# Patient Record
Sex: Female | Born: 1935 | Race: White | Hispanic: No | Smoking: Current every day smoker
Health system: Southern US, Community
[De-identification: ages and names within clinical notes are randomized; demographics above are authoritative.]

## PROBLEM LIST (undated history)

## (undated) DIAGNOSIS — E039 Hypothyroidism, unspecified: Secondary | ICD-10-CM

## (undated) DIAGNOSIS — I10 Essential (primary) hypertension: Secondary | ICD-10-CM

## (undated) DIAGNOSIS — S42009A Fracture of unspecified part of unspecified clavicle, initial encounter for closed fracture: Secondary | ICD-10-CM

## (undated) DIAGNOSIS — R011 Cardiac murmur, unspecified: Secondary | ICD-10-CM

## (undated) DIAGNOSIS — M1990 Unspecified osteoarthritis, unspecified site: Secondary | ICD-10-CM

## (undated) DIAGNOSIS — C3490 Malignant neoplasm of unspecified part of unspecified bronchus or lung: Secondary | ICD-10-CM

## (undated) DIAGNOSIS — Z8673 Personal history of transient ischemic attack (TIA), and cerebral infarction without residual deficits: Secondary | ICD-10-CM

## (undated) DIAGNOSIS — C419 Malignant neoplasm of bone and articular cartilage, unspecified: Secondary | ICD-10-CM

## (undated) DIAGNOSIS — C4490 Unspecified malignant neoplasm of skin, unspecified: Secondary | ICD-10-CM

## (undated) HISTORY — DX: Cardiac murmur, unspecified: R01.1

## (undated) HISTORY — PX: ABDOMINAL HYSTERECTOMY: SHX81

## (undated) HISTORY — DX: Essential (primary) hypertension: I10

## (undated) HISTORY — PX: LEG SURGERY: SHX1003

## (undated) HISTORY — DX: Hypothyroidism, unspecified: E03.9

## (undated) HISTORY — DX: Unspecified osteoarthritis, unspecified site: M19.90

## (undated) HISTORY — DX: Unspecified malignant neoplasm of skin, unspecified: C44.90

---

## 1971-07-12 HISTORY — PX: LAMINECTOMY: SHX219

## 2008-10-08 ENCOUNTER — Emergency Department (HOSPITAL_COMMUNITY): Admission: EM | Admit: 2008-10-08 | Discharge: 2008-10-08 | Payer: Self-pay | Admitting: Emergency Medicine

## 2013-06-25 ENCOUNTER — Encounter: Payer: Self-pay | Admitting: Interventional Cardiology

## 2013-06-25 ENCOUNTER — Ambulatory Visit (INDEPENDENT_AMBULATORY_CARE_PROVIDER_SITE_OTHER): Payer: Medicare Other | Admitting: Interventional Cardiology

## 2013-06-25 ENCOUNTER — Encounter: Payer: Self-pay | Admitting: Cardiology

## 2013-06-25 VITALS — BP 146/90 | HR 58 | Ht 64.0 in | Wt 146.0 lb

## 2013-06-25 DIAGNOSIS — F17200 Nicotine dependence, unspecified, uncomplicated: Secondary | ICD-10-CM

## 2013-06-25 DIAGNOSIS — E782 Mixed hyperlipidemia: Secondary | ICD-10-CM

## 2013-06-25 DIAGNOSIS — I359 Nonrheumatic aortic valve disorder, unspecified: Secondary | ICD-10-CM

## 2013-06-25 DIAGNOSIS — I10 Essential (primary) hypertension: Secondary | ICD-10-CM

## 2013-06-25 NOTE — Progress Notes (Signed)
Patient ID: Nichole Werner, female   DOB: 01-23-1935, 78 y.o.   MRN: [id number]     Patient ID: Nichole Werner MRN: [id number] DOB/AGE: [birth year] 78 y.o.   Referring Physician Dr. Ihor Dow   Reason for Consultation: murmur  HPI: 78 y/o who has had a calcified aortic valve by eho in 2005.  She was found to have a murmur on exam.  SHe has not had a stress test before.  A chemical stress test was suggested at one point.  She declined.  She denies CP, pressure tightness in her chest.  She does not walk much because of back and leg surgery.  She lives in an independent living.  No lightheadedness, syncope, palpitations.  No nausea, vomiting, orthopnea.   I would the records. She did not recall having an echocardiogram in 2012. She had moderate aortic valve stenosis at that time. Normal left jugular function. She currently denies any cardiac symptoms. She is not interested in stopping smoking. She states that a cholesterol medicine as been recommended in the past. She has declined taking any lipid-lowering therapy.   Current Outpatient Prescriptions  Medication Sig Dispense Refill  . levothyroxine (SYNTHROID, LEVOTHROID) 25 MCG tablet Take 25 mcg by mouth daily before breakfast.      . lisinopril (PRINIVIL,ZESTRIL) 10 MG tablet Take 10 mg by mouth daily.      . metoprolol succinate (TOPROL-XL) 100 MG 24 hr tablet Take 100 mg by mouth daily. Take with or immediately following a meal.      . naproxen sodium (ANAPROX) 220 MG tablet Take 220 mg by mouth as needed.       No current facility-administered medications for this visit.   Past Medical History  Diagnosis Date  . Hypertension   . OA (osteoarthritis)   . Skin cancer   . Hypothyroidism   . Undiagnosed cardiac murmurs     No family history on file.  History   Social History  . Marital Status: Widowed    Spouse Name: N/A    Number of Children: N/A  . Years of Education: N/A   Occupational History  . Not on file.   Social  History Main Topics  . Smoking status: Current Some Day Smoker  . Smokeless tobacco: Not on file     Comment: about 5 cigarettes daily  . Alcohol Use: No  . Drug Use: No  . Sexual Activity: Not on file   Other Topics Concern  . Not on file   Social History Narrative  . No narrative on file    No past surgical history on file.    (Not in a hospital admission)  Review of systems complete and found to be negative unless listed above .  No nausea, vomiting.  No fever chills, No focal weakness,  No palpitations.  Physical Exam: Filed Vitals:   06/25/13 1450  BP: 146/90  Pulse: 58    Weight: 146 lb (66.225 kg)  Physical exam:  [place]/AT EOMI No JVD, No carotid bruit RRR S1S2  No wheezing Soft. NT, nondistended No edema. No focal motor or sensory deficits Normal affect  Labs:   No results found for this basename: WBC, HGB, HCT, MCV, PLT   No results found for this basename: NA, K, CL, CO2, BUN, CREATININE, CALCIUM, LABALBU, PROT, BILITOT, ALKPHOS, ALT, AST, GLUCOSE,  in the last 168 hours No results found for this basename: CKTOTAL, CKMB, CKMBINDEX, TROPONINI    No results found for this basename: CHOL  No results found for this basename: HDL   No results found for this basename: LDLCALC   No results found for this basename: TRIG   No results found for this basename: CHOLHDL   No results found for this basename: LDLDIRECT      Radiology:2012 echo showed moderate AS.   EKG: NSR, NSST  ASSESSMENT AND PLAN:   Heart murmur: Will check echocardiogram.  She had an echo in 2005 showing calcificaiton of the aortic valve. Echocardiogram in 2012 showed moderate aortic stenosis.  Tobacco abuse:  She needs to stop smoking.   HTN: Borderline control.  She should get it checked.  Continue lisinopril.  Hyperlipidemia:  LDL 177.  Simvastatin was recommended.  She is not taking that at this time.  I think a cholesterol-lowering medicine will be beneficial. Signed:   Fredric Mare, MD, [facility] 06/25/2013, 3:15 PM

## 2013-06-25 NOTE — Patient Instructions (Signed)
Your physician has requested that you have an echocardiogram. Echocardiography is a painless test that uses sound waves to create images of your heart. It provides your doctor with information about the size and shape of your heart and how well your heart's chambers and valves are working. This procedure takes approximately one hour. There are no restrictions for this procedure.  Your physician wants you to follow-up in: 4 months with Dr. Eldridge Dace.  You will receive a reminder letter in the mail two months in advance. If you don't receive a letter, please call our office to schedule the follow-up appointment.

## 2013-06-28 ENCOUNTER — Ambulatory Visit: Payer: Self-pay | Admitting: Interventional Cardiology

## 2013-07-09 ENCOUNTER — Encounter: Payer: Self-pay | Admitting: Cardiovascular Disease

## 2013-07-09 ENCOUNTER — Ambulatory Visit (HOSPITAL_COMMUNITY): Payer: Medicare Other | Attending: Cardiovascular Disease | Admitting: Radiology

## 2013-07-09 DIAGNOSIS — F17200 Nicotine dependence, unspecified, uncomplicated: Secondary | ICD-10-CM | POA: Insufficient documentation

## 2013-07-09 DIAGNOSIS — R011 Cardiac murmur, unspecified: Secondary | ICD-10-CM | POA: Insufficient documentation

## 2013-07-09 DIAGNOSIS — E785 Hyperlipidemia, unspecified: Secondary | ICD-10-CM | POA: Insufficient documentation

## 2013-07-09 DIAGNOSIS — I10 Essential (primary) hypertension: Secondary | ICD-10-CM | POA: Insufficient documentation

## 2013-07-09 DIAGNOSIS — I080 Rheumatic disorders of both mitral and aortic valves: Secondary | ICD-10-CM | POA: Insufficient documentation

## 2013-07-09 DIAGNOSIS — I079 Rheumatic tricuspid valve disease, unspecified: Secondary | ICD-10-CM | POA: Insufficient documentation

## 2013-07-09 DIAGNOSIS — I359 Nonrheumatic aortic valve disorder, unspecified: Secondary | ICD-10-CM

## 2013-07-09 NOTE — Progress Notes (Signed)
Echocardiogram performed.  

## 2013-11-12 ENCOUNTER — Emergency Department (HOSPITAL_COMMUNITY): Payer: Medicare Other

## 2013-11-12 ENCOUNTER — Encounter (HOSPITAL_COMMUNITY): Payer: Self-pay | Admitting: Emergency Medicine

## 2013-11-12 ENCOUNTER — Emergency Department (HOSPITAL_COMMUNITY)
Admission: EM | Admit: 2013-11-12 | Discharge: 2013-11-12 | Disposition: A | Discharge status: Home / Self Care | Payer: Medicare Other | Attending: Emergency Medicine | Admitting: Emergency Medicine

## 2013-11-12 DIAGNOSIS — Z792 Long term (current) use of antibiotics: Secondary | ICD-10-CM | POA: Insufficient documentation

## 2013-11-12 DIAGNOSIS — M549 Dorsalgia, unspecified: Secondary | ICD-10-CM

## 2013-11-12 DIAGNOSIS — Z85828 Personal history of other malignant neoplasm of skin: Secondary | ICD-10-CM | POA: Insufficient documentation

## 2013-11-12 DIAGNOSIS — F17200 Nicotine dependence, unspecified, uncomplicated: Secondary | ICD-10-CM | POA: Insufficient documentation

## 2013-11-12 DIAGNOSIS — E039 Hypothyroidism, unspecified: Secondary | ICD-10-CM | POA: Insufficient documentation

## 2013-11-12 DIAGNOSIS — M1990 Unspecified osteoarthritis, unspecified site: Secondary | ICD-10-CM | POA: Insufficient documentation

## 2013-11-12 DIAGNOSIS — Z91040 Latex allergy status: Secondary | ICD-10-CM | POA: Insufficient documentation

## 2013-11-12 DIAGNOSIS — M545 Low back pain, unspecified: Secondary | ICD-10-CM | POA: Insufficient documentation

## 2013-11-12 DIAGNOSIS — R011 Cardiac murmur, unspecified: Secondary | ICD-10-CM | POA: Insufficient documentation

## 2013-11-12 DIAGNOSIS — Z79899 Other long term (current) drug therapy: Secondary | ICD-10-CM | POA: Insufficient documentation

## 2013-11-12 DIAGNOSIS — I10 Essential (primary) hypertension: Secondary | ICD-10-CM | POA: Insufficient documentation

## 2013-11-12 LAB — URINALYSIS, ROUTINE W REFLEX MICROSCOPIC
Bilirubin Urine: NEGATIVE
GLUCOSE, UA: NEGATIVE mg/dL
HGB URINE DIPSTICK: NEGATIVE
Ketones, ur: NEGATIVE mg/dL
Nitrite: POSITIVE — AB
PH: 6 (ref 5.0–8.0)
Protein, ur: NEGATIVE mg/dL
SPECIFIC GRAVITY, URINE: 1.017 (ref 1.005–1.030)
Urobilinogen, UA: 1 mg/dL (ref 0.0–1.0)

## 2013-11-12 LAB — URINE MICROSCOPIC-ADD ON

## 2013-11-12 MED ORDER — OXYCODONE-ACETAMINOPHEN 5-325 MG PO TABS
1.0000 | ORAL_TABLET | Freq: Once | ORAL | Status: AC
  Administered 2013-11-12: 1 via ORAL
  Filled 2013-11-12: qty 1

## 2013-11-12 MED ORDER — CEPHALEXIN 500 MG PO CAPS: 500.0000 mg | ORAL_CAPSULE | Freq: Three times a day (TID) | ORAL | Status: DC

## 2013-11-12 MED ORDER — HYDROCODONE-ACETAMINOPHEN 5-325 MG PO TABS: 1.0000 | ORAL_TABLET | Freq: Four times a day (QID) | ORAL | Status: DC | PRN

## 2013-11-12 NOTE — ED Notes (Signed)
Patient transported to X-ray 

## 2013-11-12 NOTE — Discharge Instructions (Signed)
Follow up with your family md and dr. Nelva Bush

## 2013-11-12 NOTE — ED Provider Notes (Signed)
CSN: [id number]     Arrival date & time 11/12/13  1628 History   First MD Initiated Contact with Patient 11/12/13 1721     Chief Complaint  Patient presents with  . Back Pain     (Consider location/radiation/quality/duration/timing/severity/associated sxs/prior Treatment) Patient is a 79 y.o. female presenting with back pain. The history is provided by the patient (pt complains of back pain).  Back Pain Location:  Lumbar spine Quality:  Aching Radiates to:  Does not radiate Pain severity:  Moderate Onset quality:  Gradual Timing:  Constant Progression:  Unchanged Chronicity:  New Context: not emotional stress   Associated symptoms: no abdominal pain, no chest pain and no headaches     Past Medical History  Diagnosis Date  . Hypertension   . OA (osteoarthritis)   . Skin cancer   . Hypothyroidism   . Undiagnosed cardiac murmurs    History reviewed. No pertinent past surgical history. Family History  Problem Relation Age of Onset  . Heart disease Sister     CABG   History  Substance Use Topics  . Smoking status: Current Some Day Smoker  . Smokeless tobacco: Not on file     Comment: about 5 cigarettes daily  . Alcohol Use: No   OB History   Grav Para Term Preterm Abortions TAB SAB Ect Mult Living                 Review of Systems  Constitutional: Negative for appetite change and fatigue.  HENT: Negative for congestion, ear discharge and sinus pressure.   Eyes: Negative for discharge.  Respiratory: Negative for cough.   Cardiovascular: Negative for chest pain.  Gastrointestinal: Negative for abdominal pain and diarrhea.  Genitourinary: Negative for frequency and hematuria.  Musculoskeletal: Positive for back pain.  Skin: Negative for rash.  Neurological: Negative for seizures and headaches.  Psychiatric/Behavioral: Negative for hallucinations.      Allergies  Benadryl; Benzodiazepines; and Latex  Home Medications   Prior to Admission medications    Medication Sig Start Date End Date Taking? Authorizing Provider  beta carotene w/minerals (OCUVITE) tablet Take 1 tablet by mouth daily.   Yes Historical Provider, MD  levothyroxine (SYNTHROID, LEVOTHROID) 25 MCG tablet Take 25 mcg by mouth daily before breakfast.   Yes Historical Provider, MD  lisinopril (PRINIVIL,ZESTRIL) 10 MG tablet Take 10 mg by mouth daily.   Yes Historical Provider, MD  metoprolol succinate (TOPROL-XL) 100 MG 24 hr tablet Take 100 mg by mouth daily. Take with or immediately following a meal.   Yes Historical Provider, MD  naproxen sodium (ANAPROX) 220 MG tablet Take 440 mg by mouth 2 (two) times daily as needed (pain).    Yes Historical Provider, MD  cephALEXin (KEFLEX) 500 MG capsule Take 1 capsule (500 mg total) by mouth 3 (three) times daily. 11/12/13   Maudry Diego, MD  HYDROcodone-acetaminophen (NORCO/VICODIN) 5-325 MG per tablet Take 1 tablet by mouth every 6 (six) hours as needed for moderate pain. 11/12/13   Maudry Diego, MD   BP 148/74  Pulse 70  Temp(Src) 97.9 F (36.6 C) (Oral)  Resp 14  Ht 5\' 4"  (1.626 m)  Wt 138 lb (62.596 kg)  BMI 23.68 kg/m2  SpO2 100% Physical Exam  Constitutional: She is oriented to person, place, and time. She appears well-developed.  HENT:  Head: Normocephalic.  Eyes: Conjunctivae and EOM are normal. No scleral icterus.  Neck: Neck supple. No thyromegaly present.  Cardiovascular: Normal rate and regular rhythm.  Exam reveals no gallop and no friction rub.   No murmur heard. Pulmonary/Chest: No stridor. She has no wheezes. She has no rales. She exhibits no tenderness.  Abdominal: She exhibits no distension. There is no tenderness. There is no rebound.  Musculoskeletal: Normal range of motion. She exhibits tenderness. She exhibits no edema.  Tender lumbar spine  Lymphadenopathy:    She has no cervical adenopathy.  Neurological: She is oriented to person, place, and time. She exhibits normal muscle tone. Coordination normal.   Skin: No rash noted. No erythema.  Psychiatric: She has a normal mood and affect. Her behavior is normal.    ED Course  Procedures (including critical care time) Labs Review Labs Reviewed  URINALYSIS, ROUTINE W REFLEX MICROSCOPIC - Abnormal; Notable for the following:    APPearance CLOUDY (*)    Nitrite POSITIVE (*)    Leukocytes, UA SMALL (*)    All other components within normal limits  URINE MICROSCOPIC-ADD ON - Abnormal; Notable for the following:    Bacteria, UA MANY (*)    All other components within normal limits    Imaging Review Dg Lumbar Spine Complete  11/12/2013   CLINICAL DATA:  Low back pain  EXAM: LUMBAR SPINE - COMPLETE 4+ VIEW  COMPARISON:  None  FINDINGS: There is a compression deformity involving the L4 vertebra. This is age-indeterminate with loss of approximately 15% of the vertebral body height. Mild multi level lumbar degenerative disc disease is noted. This is most severe at L5-S1. Atherosclerotic disease involves the abdominal aorta.  IMPRESSION: 1. L4 compression fracture is age indeterminate. 2. Lumbar degenerative disc disease 3. Atherosclerosis.   Electronically Signed   By: Kerby Moors M.D.   On: 11/12/2013 18:00     EKG Interpretation None      MDM   Final diagnoses:  Back pain    The chart was scribed for me under my direct supervision.  I personally performed the history, physical, and medical decision making and all procedures in the evaluation of this patient.Maudry Diego, MD 11/12/13 [phone]

## 2013-11-12 NOTE — ED Notes (Signed)
Pt states that she had a laminectomy years ago and has had no problem with her back since until several days ago. Has been taking aleve at home w/ no relief. Alert and oriented. Ambulatory.

## 2013-11-20 ENCOUNTER — Other Ambulatory Visit (HOSPITAL_COMMUNITY): Payer: Self-pay | Admitting: Physical Medicine and Rehabilitation

## 2013-11-20 DIAGNOSIS — R935 Abnormal findings on diagnostic imaging of other abdominal regions, including retroperitoneum: Secondary | ICD-10-CM

## 2013-11-20 DIAGNOSIS — C419 Malignant neoplasm of bone and articular cartilage, unspecified: Secondary | ICD-10-CM

## 2013-11-20 HISTORY — DX: Malignant neoplasm of bone and articular cartilage, unspecified: C41.9

## 2013-11-21 ENCOUNTER — Telehealth: Payer: Self-pay | Admitting: Hematology & Oncology

## 2013-11-21 ENCOUNTER — Other Ambulatory Visit: Payer: Self-pay | Admitting: Hematology & Oncology

## 2013-11-21 DIAGNOSIS — C3490 Malignant neoplasm of unspecified part of unspecified bronchus or lung: Secondary | ICD-10-CM

## 2013-11-21 NOTE — Telephone Encounter (Signed)
Left message for medical records to fax MRI results, per Dr. Antonieta Pert request, he is also aware of bone scan on 5-28

## 2013-11-21 NOTE — Telephone Encounter (Signed)
Pt aware of 5-15 PET be NPO 6 hrs. Nichole Werner will let pt know to be here on 5-18 at 1030 for lab. She is aware to be NPO 4 hrs and to drink contrast for 5-18 CT

## 2013-11-22 ENCOUNTER — Telehealth: Payer: Self-pay | Admitting: Hematology & Oncology

## 2013-11-22 ENCOUNTER — Encounter (HOSPITAL_COMMUNITY): Payer: Self-pay | Admitting: Emergency Medicine

## 2013-11-22 ENCOUNTER — Encounter (HOSPITAL_COMMUNITY)
Admission: RE | Admit: 2013-11-22 | Discharge: 2013-11-22 | Disposition: A | Discharge status: Home / Self Care | Payer: Medicare Other | Source: Ambulatory Visit | Attending: Hematology & Oncology | Admitting: Hematology & Oncology

## 2013-11-22 ENCOUNTER — Inpatient Hospital Stay (HOSPITAL_COMMUNITY)
Admission: EM | Admit: 2013-11-22 | Discharge: 2013-12-03 | DRG: 542 | Disposition: A | Discharge status: Hospice | Payer: Medicare Other | Attending: Internal Medicine | Admitting: Internal Medicine

## 2013-11-22 ENCOUNTER — Emergency Department (HOSPITAL_COMMUNITY): Payer: Medicare Other

## 2013-11-22 ENCOUNTER — Encounter (HOSPITAL_COMMUNITY): Payer: Self-pay

## 2013-11-22 DIAGNOSIS — I714 Abdominal aortic aneurysm, without rupture, unspecified: Secondary | ICD-10-CM | POA: Diagnosis present

## 2013-11-22 DIAGNOSIS — R011 Cardiac murmur, unspecified: Secondary | ICD-10-CM | POA: Diagnosis present

## 2013-11-22 DIAGNOSIS — S32040S Wedge compression fracture of fourth lumbar vertebra, sequela: Secondary | ICD-10-CM

## 2013-11-22 DIAGNOSIS — S3210XA Unspecified fracture of sacrum, initial encounter for closed fracture: Secondary | ICD-10-CM

## 2013-11-22 DIAGNOSIS — C7951 Secondary malignant neoplasm of bone: Secondary | ICD-10-CM | POA: Diagnosis present

## 2013-11-22 DIAGNOSIS — D473 Essential (hemorrhagic) thrombocythemia: Secondary | ICD-10-CM | POA: Diagnosis present

## 2013-11-22 DIAGNOSIS — S322XXA Fracture of coccyx, initial encounter for closed fracture: Secondary | ICD-10-CM

## 2013-11-22 DIAGNOSIS — C801 Malignant (primary) neoplasm, unspecified: Secondary | ICD-10-CM

## 2013-11-22 DIAGNOSIS — E41 Nutritional marasmus: Secondary | ICD-10-CM | POA: Diagnosis present

## 2013-11-22 DIAGNOSIS — R634 Abnormal weight loss: Secondary | ICD-10-CM | POA: Diagnosis present

## 2013-11-22 DIAGNOSIS — C3490 Malignant neoplasm of unspecified part of unspecified bronchus or lung: Secondary | ICD-10-CM | POA: Insufficient documentation

## 2013-11-22 DIAGNOSIS — D75839 Thrombocytosis, unspecified: Secondary | ICD-10-CM

## 2013-11-22 DIAGNOSIS — S32040A Wedge compression fracture of fourth lumbar vertebra, initial encounter for closed fracture: Secondary | ICD-10-CM

## 2013-11-22 DIAGNOSIS — M1990 Unspecified osteoarthritis, unspecified site: Secondary | ICD-10-CM | POA: Diagnosis present

## 2013-11-22 DIAGNOSIS — N390 Urinary tract infection, site not specified: Secondary | ICD-10-CM

## 2013-11-22 DIAGNOSIS — F17200 Nicotine dependence, unspecified, uncomplicated: Secondary | ICD-10-CM

## 2013-11-22 DIAGNOSIS — Z91041 Radiographic dye allergy status: Secondary | ICD-10-CM

## 2013-11-22 DIAGNOSIS — Z885 Allergy status to narcotic agent status: Secondary | ICD-10-CM

## 2013-11-22 DIAGNOSIS — C7952 Secondary malignant neoplasm of bone marrow: Secondary | ICD-10-CM

## 2013-11-22 DIAGNOSIS — R404 Transient alteration of awareness: Secondary | ICD-10-CM | POA: Diagnosis not present

## 2013-11-22 DIAGNOSIS — E43 Unspecified severe protein-calorie malnutrition: Secondary | ICD-10-CM

## 2013-11-22 DIAGNOSIS — Z6825 Body mass index (BMI) 25.0-25.9, adult: Secondary | ICD-10-CM

## 2013-11-22 DIAGNOSIS — R599 Enlarged lymph nodes, unspecified: Secondary | ICD-10-CM | POA: Diagnosis present

## 2013-11-22 DIAGNOSIS — I809 Phlebitis and thrombophlebitis of unspecified site: Secondary | ICD-10-CM | POA: Diagnosis present

## 2013-11-22 DIAGNOSIS — Z8673 Personal history of transient ischemic attack (TIA), and cerebral infarction without residual deficits: Secondary | ICD-10-CM

## 2013-11-22 DIAGNOSIS — E782 Mixed hyperlipidemia: Secondary | ICD-10-CM

## 2013-11-22 DIAGNOSIS — I10 Essential (primary) hypertension: Secondary | ICD-10-CM

## 2013-11-22 DIAGNOSIS — Z66 Do not resuscitate: Secondary | ICD-10-CM | POA: Diagnosis present

## 2013-11-22 DIAGNOSIS — M84453A Pathological fracture, unspecified femur, initial encounter for fracture: Principal | ICD-10-CM | POA: Diagnosis present

## 2013-11-22 DIAGNOSIS — Z79899 Other long term (current) drug therapy: Secondary | ICD-10-CM

## 2013-11-22 DIAGNOSIS — D72829 Elevated white blood cell count, unspecified: Secondary | ICD-10-CM

## 2013-11-22 DIAGNOSIS — E039 Hypothyroidism, unspecified: Secondary | ICD-10-CM

## 2013-11-22 DIAGNOSIS — R262 Difficulty in walking, not elsewhere classified: Secondary | ICD-10-CM | POA: Diagnosis present

## 2013-11-22 DIAGNOSIS — Z85828 Personal history of other malignant neoplasm of skin: Secondary | ICD-10-CM

## 2013-11-22 DIAGNOSIS — M84459A Pathological fracture, hip, unspecified, initial encounter for fracture: Secondary | ICD-10-CM

## 2013-11-22 DIAGNOSIS — C799 Secondary malignant neoplasm of unspecified site: Secondary | ICD-10-CM

## 2013-11-22 DIAGNOSIS — M8440XA Pathological fracture, unspecified site, initial encounter for fracture: Secondary | ICD-10-CM

## 2013-11-22 DIAGNOSIS — Z7401 Bed confinement status: Secondary | ICD-10-CM

## 2013-11-22 DIAGNOSIS — I359 Nonrheumatic aortic valve disorder, unspecified: Secondary | ICD-10-CM

## 2013-11-22 DIAGNOSIS — Z515 Encounter for palliative care: Secondary | ICD-10-CM

## 2013-11-22 DIAGNOSIS — Z888 Allergy status to other drugs, medicaments and biological substances status: Secondary | ICD-10-CM

## 2013-11-22 DIAGNOSIS — C3410 Malignant neoplasm of upper lobe, unspecified bronchus or lung: Secondary | ICD-10-CM | POA: Diagnosis present

## 2013-11-22 DIAGNOSIS — S32009A Unspecified fracture of unspecified lumbar vertebra, initial encounter for closed fracture: Secondary | ICD-10-CM

## 2013-11-22 DIAGNOSIS — Z91040 Latex allergy status: Secondary | ICD-10-CM

## 2013-11-22 DIAGNOSIS — M8448XA Pathological fracture, other site, initial encounter for fracture: Secondary | ICD-10-CM | POA: Diagnosis present

## 2013-11-22 DIAGNOSIS — M48061 Spinal stenosis, lumbar region without neurogenic claudication: Secondary | ICD-10-CM | POA: Diagnosis present

## 2013-11-22 HISTORY — DX: Malignant neoplasm of bone and articular cartilage, unspecified: C41.9

## 2013-11-22 HISTORY — DX: Malignant neoplasm of unspecified part of unspecified bronchus or lung: C34.90

## 2013-11-22 HISTORY — DX: Fracture of unspecified part of unspecified clavicle, initial encounter for closed fracture: S42.009A

## 2013-11-22 HISTORY — DX: Personal history of transient ischemic attack (TIA), and cerebral infarction without residual deficits: Z86.73

## 2013-11-22 LAB — COMPREHENSIVE METABOLIC PANEL
ALBUMIN: 3.2 g/dL — AB (ref 3.5–5.2)
ALT: 10 U/L (ref 0–35)
AST: 15 U/L (ref 0–37)
Alkaline Phosphatase: 106 U/L (ref 39–117)
BILIRUBIN TOTAL: 0.4 mg/dL (ref 0.3–1.2)
BUN: 20 mg/dL (ref 6–23)
CHLORIDE: 96 meq/L (ref 96–112)
CO2: 26 mEq/L (ref 19–32)
CREATININE: 0.94 mg/dL (ref 0.50–1.10)
Calcium: 9.4 mg/dL (ref 8.4–10.5)
GFR calc Af Amer: 66 mL/min — ABNORMAL LOW (ref >90)
GFR calc non Af Amer: 57 mL/min — ABNORMAL LOW (ref >90)
Glucose, Bld: 102 mg/dL — ABNORMAL HIGH (ref 70–99)
Potassium: 4.1 mEq/L (ref 3.7–5.3)
Sodium: 135 mEq/L — ABNORMAL LOW (ref 137–147)
Total Protein: 7.5 g/dL (ref 6.0–8.3)

## 2013-11-22 LAB — CBC WITH DIFFERENTIAL/PLATELET
BASOS ABS: 0.1 10*3/uL (ref 0.0–0.1)
BASOS PCT: 0 % (ref 0–1)
Eosinophils Absolute: 0.5 10*3/uL (ref 0.0–0.7)
Eosinophils Relative: 3 % (ref 0–5)
HEMATOCRIT: 33.5 % — AB (ref 36.0–46.0)
Hemoglobin: 11.1 g/dL — ABNORMAL LOW (ref 12.0–15.0)
Lymphocytes Relative: 15 % (ref 12–46)
Lymphs Abs: 2.4 10*3/uL (ref 0.7–4.0)
MCH: 28.5 pg (ref 26.0–34.0)
MCHC: 33.1 g/dL (ref 30.0–36.0)
MCV: 86.1 fL (ref 78.0–100.0)
MONO ABS: 1.6 10*3/uL — AB (ref 0.1–1.0)
Monocytes Relative: 10 % (ref 3–12)
NEUTROS ABS: 11.5 10*3/uL — AB (ref 1.7–7.7)
NEUTROS PCT: 72 % (ref 43–77)
PLATELETS: 416 10*3/uL — AB (ref 150–400)
RBC: 3.89 MIL/uL (ref 3.87–5.11)
RDW: 14.3 % (ref 11.5–15.5)
WBC: 16.1 10*3/uL — ABNORMAL HIGH (ref 4.0–10.5)

## 2013-11-22 LAB — GLUCOSE, CAPILLARY: GLUCOSE-CAPILLARY: 110 mg/dL — AB (ref 70–99)

## 2013-11-22 LAB — LACTATE DEHYDROGENASE: LDH: 319 U/L — AB (ref 94–250)

## 2013-11-22 MED ORDER — NICOTINE 14 MG/24HR TD PT24
14.0000 mg | MEDICATED_PATCH | Freq: Every day | TRANSDERMAL | Status: DC
  Administered 2013-11-24 – 2013-12-01 (×7): 14 mg via TRANSDERMAL
  Filled 2013-11-22 (×10): qty 1

## 2013-11-22 MED ORDER — SODIUM CHLORIDE 0.9 % IV SOLN
INTRAVENOUS | Status: DC
  Administered 2013-11-23 – 2013-11-25 (×4): via INTRAVENOUS

## 2013-11-22 MED ORDER — ACETAMINOPHEN 325 MG PO TABS
650.0000 mg | ORAL_TABLET | Freq: Four times a day (QID) | ORAL | Status: DC | PRN
  Administered 2013-11-24 – 2013-12-03 (×9): 650 mg via ORAL
  Filled 2013-11-22 (×9): qty 2

## 2013-11-22 MED ORDER — ONDANSETRON HCL 4 MG/2ML IJ SOLN
4.0000 mg | Freq: Four times a day (QID) | INTRAMUSCULAR | Status: DC | PRN
  Administered 2013-11-23 (×2): 4 mg via INTRAVENOUS
  Filled 2013-11-22 (×3): qty 2

## 2013-11-22 MED ORDER — IOHEXOL 300 MG/ML  SOLN
100.0000 mL | Freq: Once | INTRAMUSCULAR | Status: AC | PRN
  Administered 2013-11-22: 100 mL via INTRAVENOUS

## 2013-11-22 MED ORDER — LISINOPRIL 10 MG PO TABS
10.0000 mg | ORAL_TABLET | Freq: Every day | ORAL | Status: DC
  Administered 2013-11-23 – 2013-11-27 (×5): 10 mg via ORAL
  Filled 2013-11-22 (×5): qty 1

## 2013-11-22 MED ORDER — ONDANSETRON HCL 4 MG PO TABS: 4.0000 mg | ORAL_TABLET | Freq: Four times a day (QID) | ORAL | Status: DC | PRN

## 2013-11-22 MED ORDER — METOPROLOL SUCCINATE ER 100 MG PO TB24
100.0000 mg | ORAL_TABLET | Freq: Every day | ORAL | Status: DC
  Administered 2013-11-23 – 2013-12-03 (×10): 100 mg via ORAL
  Filled 2013-11-22 (×11): qty 1

## 2013-11-22 MED ORDER — LEVOTHYROXINE SODIUM 50 MCG PO TABS
50.0000 ug | ORAL_TABLET | Freq: Every day | ORAL | Status: DC
  Administered 2013-11-23 – 2013-12-03 (×9): 50 ug via ORAL
  Filled 2013-11-22 (×12): qty 1

## 2013-11-22 MED ORDER — POLYETHYLENE GLYCOL 3350 17 G PO PACK
17.0000 g | PACK | Freq: Two times a day (BID) | ORAL | Status: DC | PRN
  Filled 2013-11-22: qty 1

## 2013-11-22 MED ORDER — SODIUM CHLORIDE 0.9 % IV BOLUS (SEPSIS)
500.0000 mL | Freq: Once | INTRAVENOUS | Status: AC
  Administered 2013-11-22: 500 mL via INTRAVENOUS

## 2013-11-22 MED ORDER — ENOXAPARIN SODIUM 30 MG/0.3ML ~~LOC~~ SOLN
30.0000 mg | Freq: Every day | SUBCUTANEOUS | Status: DC
  Administered 2013-11-23: 30 mg via SUBCUTANEOUS
  Filled 2013-11-22 (×2): qty 0.3

## 2013-11-22 MED ORDER — ACETAMINOPHEN 650 MG RE SUPP: 650.0000 mg | Freq: Four times a day (QID) | RECTAL | Status: DC | PRN

## 2013-11-22 MED ORDER — OXYCODONE HCL 5 MG PO TABS
5.0000 mg | ORAL_TABLET | ORAL | Status: DC | PRN
  Administered 2013-11-24 – 2013-11-28 (×5): 5 mg via ORAL
  Filled 2013-11-22 (×5): qty 1

## 2013-11-22 MED ORDER — OCUVITE PO TABS
1.0000 | ORAL_TABLET | Freq: Every day | ORAL | Status: DC
  Administered 2013-11-23 – 2013-12-01 (×8): 1 via ORAL
  Filled 2013-11-22 (×10): qty 1

## 2013-11-22 MED ORDER — ALUM & MAG HYDROXIDE-SIMETH 200-200-20 MG/5ML PO SUSP: 30.0000 mL | Freq: Four times a day (QID) | ORAL | Status: DC | PRN

## 2013-11-22 MED ORDER — FLUDEOXYGLUCOSE F - 18 (FDG) INJECTION
8.8000 | Freq: Once | INTRAVENOUS | Status: AC | PRN
  Administered 2013-11-22: 8.8 via INTRAVENOUS

## 2013-11-22 MED ORDER — DEXTROSE 5 % IV SOLN
1.0000 g | Freq: Every day | INTRAVENOUS | Status: DC
  Administered 2013-11-23 – 2013-11-26 (×5): 1 g via INTRAVENOUS
  Filled 2013-11-22 (×6): qty 10

## 2013-11-22 MED ORDER — HYDROMORPHONE HCL PF 1 MG/ML IJ SOLN
0.5000 mg | INTRAMUSCULAR | Status: DC | PRN
  Administered 2013-11-23 – 2013-11-26 (×7): 1 mg via INTRAVENOUS
  Administered 2013-11-27 (×2): 0.5 mg via INTRAVENOUS
  Administered 2013-11-27 – 2013-11-29 (×6): 1 mg via INTRAVENOUS
  Administered 2013-11-29: 0.5 mg via INTRAVENOUS
  Filled 2013-11-22 (×16): qty 1

## 2013-11-22 NOTE — H&P (Signed)
[facility] History and Physical  Nichole Werner KPT:[id number] DOB: 1934/11/21 DOA: 11/22/2013  Referring physician: EDP PCP: Gavin Pound, MD  Specialists:   Chief Complaint: Right Hip Pain  HPI: Nichole Werner is a 79 y.o. female with recent diagnosis of Metastatic Lung Cancer who was sent for a Pet Scan this Afternoon which returned revealing diffuse metastatic Disease and a RUL Lung Tumor, as well as a Pathologic Fracture of the Right Hip.   She had been having increased low back pain for several weeks and was sent for an MRI by Dr. Nelva Bush who interpreted a neoplastic process of the L4 Compression fracture which was found.   She report having right hip pain for the past 4 days and she was sent for an PET scan today by Dr. Marin Olp, the results of which revealed a pathologic fracture of the right hip.  She was referred tot he ED.    She denies any history of trauma or fall.    She denies having fevers chills or night sweats, she does report having a loss of appetite over the past few months and an unintentional weight loss of 20 pounds over the past 6 months.      Review of Systems:  Constitutional: +Weight Loss, No Weight Gain, Night Sweats, Fevers, Chills, Fatigue, or Generalized Weakness HEENT: No Headaches, Difficulty Swallowing,Tooth/Dental Problems,Sore Throat,  No Sneezing, Rhinitis, Ear Ache, Nasal Congestion, or Post Nasal Drip,  Cardio-vascular:  No Chest pain, Orthopnea, PND, Edema in lower extremities, Anasarca, Dizziness, Palpitations  Resp: No Dyspnea, No DOE, No Cough, No Hemoptysis, No Wheezing.    GI: No Heartburn, Indigestion, Abdominal Pain, Nausea, Vomiting, Diarrhea, Change in Bowel Habits,  +Loss of Appetite  GU: No Dysuria, Change in Color of Urine, No Urgency or Frequency.  No flank pain.  Musculoskeletal: +Right Hip Pain,  +Back Pain.  Neurologic: No Syncope, No Seizures, Muscle Weakness, Paresthesia, Vision Disturbance or Loss, No Diplopia, No Vertigo,  +Difficulty Walking,  Skin: No Rash or Lesions. Psych: No Change in Mood or Affect. No Depression or Anxiety. No Memory loss. No Confusion or Hallucinations   Past Medical History  Diagnosis Date  . Hypertension   . OA (osteoarthritis)   . Hypothyroidism   . Undiagnosed cardiac murmurs   . Hx-TIA (transient ischemic attack)   . Clavicle fracture   . Skin cancer   . Bone cancer 11/20/13  . Lung cancer 11/22/13      Past Surgical History  Procedure Laterality Date  . Laminectomy  1973    with disc removal  . Abdominal hysterectomy    . Leg surgery      "rebuilt left leg"       Prior to Admission medications   Medication Sig Start Date End Date Taking? Authorizing Provider  beta carotene w/minerals (OCUVITE) tablet Take 1 tablet by mouth daily.   Yes Historical Provider, MD  HYDROcodone-acetaminophen (NORCO/VICODIN) 5-325 MG per tablet Take 1 tablet by mouth every 6 (six) hours as needed for moderate pain. 11/12/13  Yes Maudry Diego, MD  levothyroxine (SYNTHROID, LEVOTHROID) 50 MCG tablet Take 50 mcg by mouth daily before breakfast.   Yes Historical Provider, MD  lisinopril (PRINIVIL,ZESTRIL) 10 MG tablet Take 10 mg by mouth daily.   Yes Historical Provider, MD  metoprolol succinate (TOPROL-XL) 100 MG 24 hr tablet Take 100 mg by mouth daily. Take with or immediately following a meal.   Yes Historical Provider, MD  naproxen sodium (ANAPROX) 220 MG tablet Take  440 mg by mouth 2 (two) times daily as needed (pain).    Yes Historical Provider, MD  polyethylene glycol (MIRALAX / GLYCOLAX) packet Take 17 g by mouth 2 (two) times daily as needed for moderate constipation.   Yes Historical Provider, MD  cephALEXin (KEFLEX) 500 MG capsule Take 1 capsule (500 mg total) by mouth 3 (three) times daily. 11/12/13   Maudry Diego, MD      Allergies  Allergen Reactions  . Benadryl [Diphenhydramine]     Speeds everything up  . Benzodiazepines     Doesn't know  . Contrast Media [Iodinated  Diagnostic Agents]     Pt states it burns  . Morphine And Related     Keeps her awake  . Latex Rash    Social History:  reports that she has been smoking.  She has never used smokeless tobacco. She reports that she does not drink alcohol or use illicit drugs.     Family History  Problem Relation Age of Onset  . Heart disease Sister     CABG  . Kidney failure Father   . Kidney failure Mother       Physical Exam:  GEN:  Pleasant Well Nourished and Well developed Elderly  79 y.o. Caucasian female examined  and in no acute distress; cooperative with exam Filed Vitals:   11/22/13 1949 11/22/13 2205  BP: 179/58 177/55  Pulse: 79 81  Temp: 98 F (36.7 C)   TempSrc: Oral Oral  Resp: 20 18  SpO2: 100% 96%   Blood pressure 177/55, pulse 81, temperature 98 F (36.7 C), temperature source Oral, resp. rate 18, SpO2 96.00%. PSYCH: She is alert and oriented x4; does not appear anxious does not appear depressed; affect is normal HEENT: Normocephalic and Atraumatic, Mucous membranes pink; PERRLA; EOM intact; Fundi:  Benign;  No scleral icterus, Nares: Patent, Oropharynx: Clear, Edentulous With Dentures,  Neck:  FROM, no cervical lymphadenopathy nor thyromegaly or carotid bruit; no JVD; Breasts:: Not examined CHEST WALL: No tenderness CHEST: Normal respiration, clear to auscultation bilaterally HEART: Regular rate and rhythm; no murmurs rubs or gallops BACK: No kyphosis or scoliosis; no CVA tenderness ABDOMEN: Positive Bowel Sounds, soft non-tender; no masses, no organomegaly. Rectal Exam: Not done EXTREMITIES: No cyanosis  Except for hyperemic changes of the plantar surface of both feet,  No clubbing or edema; no ulcerations. Genitalia: not examined PULSES: 2+ and symmetric SKIN: Normal hydration no rash or ulceration CNS:  A X O X 4, No Focal Deficits.    Vascular: pulses palpable throughout    Labs on Admission:  Basic Metabolic Panel:  Recent Labs Lab 11/22/13 2045  NA 135*   K 4.1  CL 96  CO2 26  GLUCOSE 102*  BUN 20  CREATININE 0.94  CALCIUM 9.4   Liver Function Tests:  Recent Labs Lab 11/22/13 2045  AST 15  ALT 10  ALKPHOS 106  BILITOT 0.4  PROT 7.5  ALBUMIN 3.2*   No results found for this basename: LIPASE, AMYLASE,  in the last 168 hours No results found for this basename: AMMONIA,  in the last 168 hours CBC:  Recent Labs Lab 11/22/13 2045  WBC 16.1*  NEUTROABS 11.5*  HGB 11.1*  HCT 33.5*  MCV 86.1  PLT 416*   Cardiac Enzymes: No results found for this basename: CKTOTAL, CKMB, CKMBINDEX, TROPONINI,  in the last 168 hours  BNP (last 3 results) No results found for this basename: PROBNP,  in the last 8760 hours  CBG:  Recent Labs Lab 11/22/13 1102  GLUCAP 110*    Radiological Exams on Admission: Ct Head W Wo Contrast  11/22/2013   CLINICAL DATA:  Right hip fracture, evaluation for intracranial metastasis.  EXAM: CT HEAD WITHOUT AND WITH CONTRAST  TECHNIQUE: Contiguous axial images were obtained from the base of the skull through the vertex without and with intravenous contrast  CONTRAST:  182mL OMNIPAQUE IOHEXOL 300 MG/ML  SOLN  COMPARISON:  NM PET IMAGE INITIAL (PI) SKULL BASE TO THIGH dated 11/22/2013; DG LUMBAR SPINE COMPLETE dated 11/12/2013  FINDINGS: Moderate to severe ventriculomegaly, predominantly on the basis of global parenchymal brain volume loss, however there is mild effacement of the sulci at the convexities. No intraparenchymal hemorrhage, mass effect nor midline shift. Prompt 1 supratentorial white matter hypodensities are non-specific suggest sequelae of chronic small vessel ischemic disease. No acute large vascular territory infarcts. Remote right basal ganglia cystic lacunar infarct in addition to tiny hypodensities in bilateral basal ganglia and thalamus suggesting remote ischemia.  No abnormal extra-axial fluid collections. Basal cisterns are patent. Moderate calcific atherosclerosis of the carotid siphons.  No skull  fracture. Subcentimeter lucency in the right occipital calvarium, axial 15/29. The included ocular globes and orbital contents are non-suspicious ; the elongated symmetric ocular globes may reflect myopia for which ophthalmological examination could be considered. The mastoid aircells and included paranasal sinuses are well-aerated.  IMPRESSION: No acute intracranial process ; no CT findings of intracranial metastasis though, MRI of the brain with contrast would be more sensitive. However, subcentimeter lucency in right occipital calvarium, this nonspecific, and metastasis is not excluded (should MRI of the brain be performed, recommend fat saturation post gad imaging).  Moderate to severe global brain atrophy, with disproportionate sulcal effacement of the convexities which can be assessed with normal pressure hydrocephalus.  Moderate to severe white matter changes suggest chronic small vessel ischemic disease with remote thalamic and basal ganglia lacunar infarcts.   Electronically Signed   By: Elon Alas   On: 11/22/2013 22:23   Nm Pet Image Initial (pi) Skull Base To Thigh  11/22/2013   CLINICAL DATA:  Initial treatment strategy for Lung cancer.  EXAM: NUCLEAR MEDICINE PET SKULL BASE TO THIGH  TECHNIQUE: 8.8 mCi F-18 FDG was injected intravenously. Full-ring PET imaging was performed from the skull base to thigh after the radiotracer. CT data was obtained and used for attenuation correction and anatomic localization.  FASTING BLOOD GLUCOSE:  Value: 110 mg/dl  COMPARISON:  None.  FINDINGS: NECK  No hypermetabolic lymph nodes in the neck.  CHEST  Moderate changes of centrilobular emphysema identified. The pulmonary nodule within the right upper lobe measures 1.3 cm and has an SUV max equal to 6.3. Calcified granuloma is identified within the left lower lobe.  Normal heart size. No pericardial effusion. There is calcified atherosclerotic disease involving the thoracic aorta. Calcifications also involve  the LAD, left circumflex and RCA coronary arteries. Hypermetabolic right hilar lymph node has an SUV max equal to 3.9. Calcified left sided mediastinal and left hilar lymph nodes are identified.  ABDOMEN/PELVIS  Multiple stones are identified within the gallbladder. The pancreas is within normal limits. Calcified granulomas identified within the spleen.  The adrenal glands are both normal. Left renal cyst is identified. This is incompletely characterized without IV contrast. No hypermetabolic upper abdominal or pelvic lymph nodes identified.  Calcified atherosclerotic disease involves the abdominal aorta. The aorta has a maximum AP dimension of 3.5 cm.  SKELETON  Multifocal hypermetabolic bone  metastases are identified. Metastasis within the central portion of the sacrum measure 4.5 cm and has an SUV max equal to 14.8. There is a metastasis with pathologic fracture involving the lesser trochanter of the proximal right femur. This has an SUV max equal to 13.8. Tumor involving the right side of the C1 vertebra has an SUV max equal to 13.7. There is tumor involving the L4 vertebra with associated superior endplate fracture deformity. At T3 and T4 tumor appears to extend posteriorly into the epidural space.  IMPRESSION: 1. Examination is positive for hypermetabolic tumor within the right upper lobe with associated ipsilateral hilar lymph node metastasis. 2. Multifocal bone metastasis involving the axial and proximal appendicular skeleton. There are associated pathologic fractures involving the superior endplate of L4 and the lesser trochanter of the proximal right femur. Suspicious activity posterior to the T3 and T4 vertebra may reflect epidural extension of tumor. This could be better assessed with thoracic spine MRI. 3. Atherosclerotic disease including multi vessel coronary artery calcifications. Aneurysmal dilatation of the infrarenal abdominal aorta measures 3.5 cm.   Electronically Signed   By: Kerby Moors  M.D.   On: 11/22/2013 14:45       Assessment/Plan:   79 y.o. female with  Principal Problem:   Pathologic fracture Active Problems:   Metastatic lung cancer (metastasis from lung to other site)   Compression fracture of L4 lumbar vertebra   UTI (lower urinary tract infection)   Mixed hyperlipidemia   Tobacco use disorder   Essential hypertension, benign   Hypothyroidism   Leukocytosis   Thrombocytosis    1.    Pathologic Fracture of Right Hip-   Due to Metastatic Dz of RUL Lung Cancer,   Orthopedics Consulted to see in AM by EDP.  Bed Rest and Pain control ordered.     2.    Metastatic Lung Cancer (RUL Lung with Bone Mets)-   Consult Oncology in AM, has had outpatient MRI  Per Dr. Nelva Bush, and PET Scan on 05/15 ordered by Dr. Marin Olp.    3.    Compression Fx of L4- Pain control ordered. And further plan per oncology and possible Radiation Oncology Options if chosen.    4.    UTI-   Urine C+S sent, IV Rocephin daily then adjust PRN Urine culture Result.     5.    HTN- continue Lisinopril, and Monitor BPs.    6.    Hypothyroid-  Check TSH level and continue Levothyroxine, and adjust PRN.   7.    Leukocytosis-   Reactive due to #1,#2, #3, and #4.  Monitor Trend.     8.    Thrombocytosis-  Reactive due to #1, #2, #3, and #4.   Monitor Trend.     9.    Tobacco Use Disorder-   Nicotine Patch daily.    10.   DVT prophylaxis with Lovenox.       Code Status:  FULL CODE  Family Communication:    Daughters at Bedside Disposition Plan:         Time spent:  [facility] Pager [phone]  If 7PM-7AM, please contact night-coverage [email or web address] Password [facility] 11/22/2013, 10:48 PM

## 2013-11-22 NOTE — ED Notes (Signed)
MD Hospitalist at bedside.

## 2013-11-22 NOTE — ED Provider Notes (Signed)
CSN: [id number]     Arrival date & time 11/22/13  1932 History   First MD Initiated Contact with Patient 11/22/13 1947     Chief Complaint  Patient presents with  . Hip Injury    Right hip fracture     (Consider location/radiation/quality/duration/timing/severity/associated sxs/prior Treatment) The history is provided by the patient.   patient was sent in for metastatic cancer. She was seen in the ER a week ago for a lumbar fracture that was later found to be metastatic cancer. She had no known primary. Dr. Marin Olp is a family friend and ordered outpatient PET scan. It found a lung mass with lymph node involvement. He also found several bony metastases with some pathologic fractures. There is a right left lesser trochanter fracture of the right hip. She states she is not having hip pain to earlier today. She states she's been having difficulty walking due to it. Patient states she would not want a treatment that would make her hair fall out. She states that Dr. Marin Olp told her to come to the ER.   Past Medical History  Diagnosis Date  . Hypertension   . OA (osteoarthritis)   . Skin cancer   . Hypothyroidism   . Undiagnosed cardiac murmurs   . Hx-TIA (transient ischemic attack)   . Clavicle fracture    Past Surgical History  Procedure Laterality Date  . Laminectomy  1973    with disc removal  . Abdominal hysterectomy    . Leg surgery      "rebuilt left leg"   Family History  Problem Relation Age of Onset  . Heart disease Sister     CABG   History  Substance Use Topics  . Smoking status: Current Every Day Smoker -- 1.00 packs/day for 55 years  . Smokeless tobacco: Never Used     Comment: about 5 cigarettes daily  . Alcohol Use: No   OB History   Grav Para Term Preterm Abortions TAB SAB Ect Mult Living                 Review of Systems  Constitutional: Negative for activity change and appetite change.  Eyes: Negative for pain.  Respiratory: Negative for chest  tightness and shortness of breath.   Cardiovascular: Negative for chest pain and leg swelling.  Gastrointestinal: Negative for nausea, vomiting, abdominal pain and diarrhea.  Genitourinary: Negative for flank pain.  Musculoskeletal: Positive for back pain. Negative for neck stiffness.       Right hip pain  Skin: Negative for rash.  Neurological: Negative for weakness, numbness and headaches.  Psychiatric/Behavioral: Negative for behavioral problems.      Allergies  Benadryl; Benzodiazepines; Contrast media; Morphine and related; and Latex  Home Medications   Prior to Admission medications   Medication Sig Start Date End Date Taking? Authorizing Provider  beta carotene w/minerals (OCUVITE) tablet Take 1 tablet by mouth daily.   Yes Historical Provider, MD  HYDROcodone-acetaminophen (NORCO/VICODIN) 5-325 MG per tablet Take 1 tablet by mouth every 6 (six) hours as needed for moderate pain. 11/12/13  Yes Maudry Diego, MD  levothyroxine (SYNTHROID, LEVOTHROID) 50 MCG tablet Take 50 mcg by mouth daily before breakfast.   Yes Historical Provider, MD  lisinopril (PRINIVIL,ZESTRIL) 10 MG tablet Take 10 mg by mouth daily.   Yes Historical Provider, MD  metoprolol succinate (TOPROL-XL) 100 MG 24 hr tablet Take 100 mg by mouth daily. Take with or immediately following a meal.   Yes Historical Provider, MD  naproxen sodium (ANAPROX) 220 MG tablet Take 440 mg by mouth 2 (two) times daily as needed (pain).    Yes Historical Provider, MD  polyethylene glycol (MIRALAX / GLYCOLAX) packet Take 17 g by mouth 2 (two) times daily as needed for moderate constipation.   Yes Historical Provider, MD  cephALEXin (KEFLEX) 500 MG capsule Take 1 capsule (500 mg total) by mouth 3 (three) times daily. 11/12/13   Maudry Diego, MD   BP 179/58  Pulse 79  Temp(Src) 98 F (36.7 C) (Oral)  Resp 20  SpO2 100% Physical Exam  Constitutional: She appears well-developed and well-nourished.  HENT:  Head: Normocephalic  and atraumatic.  Eyes: Pupils are equal, round, and reactive to light.  Neck: Neck supple.  Cardiovascular: Normal rate and regular rhythm.   Pulmonary/Chest: Effort normal and breath sounds normal.  Abdominal: Soft. There is no tenderness.  Musculoskeletal: She exhibits tenderness.  Tenderness right hip. Pain with movement. Neurovascular intact over her foot.  Neurological: She is alert.  Skin: Skin is warm.    ED Course  Procedures (including critical care time) Labs Review Labs Reviewed  CBC WITH DIFFERENTIAL - Abnormal; Notable for the following:    WBC 16.1 (*)    Hemoglobin 11.1 (*)    HCT 33.5 (*)    Platelets 416 (*)    Neutro Abs 11.5 (*)    Monocytes Absolute 1.6 (*)    All other components within normal limits  COMPREHENSIVE METABOLIC PANEL - Abnormal; Notable for the following:    Sodium 135 (*)    Glucose, Bld 102 (*)    Albumin 3.2 (*)    GFR calc non Af Amer 57 (*)    GFR calc Af Amer 66 (*)    All other components within normal limits  LACTATE DEHYDROGENASE - Abnormal; Notable for the following:    LDH 319 (*)    All other components within normal limits  URINALYSIS, ROUTINE W REFLEX MICROSCOPIC  PREALBUMIN    Imaging Review Nm Pet Image Initial (pi) Skull Base To Thigh  11/22/2013   CLINICAL DATA:  Initial treatment strategy for Lung cancer.  EXAM: NUCLEAR MEDICINE PET SKULL BASE TO THIGH  TECHNIQUE: 8.8 mCi F-18 FDG was injected intravenously. Full-ring PET imaging was performed from the skull base to thigh after the radiotracer. CT data was obtained and used for attenuation correction and anatomic localization.  FASTING BLOOD GLUCOSE:  Value: 110 mg/dl  COMPARISON:  None.  FINDINGS: NECK  No hypermetabolic lymph nodes in the neck.  CHEST  Moderate changes of centrilobular emphysema identified. The pulmonary nodule within the right upper lobe measures 1.3 cm and has an SUV max equal to 6.3. Calcified granuloma is identified within the left lower lobe.  Normal  heart size. No pericardial effusion. There is calcified atherosclerotic disease involving the thoracic aorta. Calcifications also involve the LAD, left circumflex and RCA coronary arteries. Hypermetabolic right hilar lymph node has an SUV max equal to 3.9. Calcified left sided mediastinal and left hilar lymph nodes are identified.  ABDOMEN/PELVIS  Multiple stones are identified within the gallbladder. The pancreas is within normal limits. Calcified granulomas identified within the spleen.  The adrenal glands are both normal. Left renal cyst is identified. This is incompletely characterized without IV contrast. No hypermetabolic upper abdominal or pelvic lymph nodes identified.  Calcified atherosclerotic disease involves the abdominal aorta. The aorta has a maximum AP dimension of 3.5 cm.  SKELETON  Multifocal hypermetabolic bone metastases are identified. Metastasis within  the central portion of the sacrum measure 4.5 cm and has an SUV max equal to 14.8. There is a metastasis with pathologic fracture involving the lesser trochanter of the proximal right femur. This has an SUV max equal to 13.8. Tumor involving the right side of the C1 vertebra has an SUV max equal to 13.7. There is tumor involving the L4 vertebra with associated superior endplate fracture deformity. At T3 and T4 tumor appears to extend posteriorly into the epidural space.  IMPRESSION: 1. Examination is positive for hypermetabolic tumor within the right upper lobe with associated ipsilateral hilar lymph node metastasis. 2. Multifocal bone metastasis involving the axial and proximal appendicular skeleton. There are associated pathologic fractures involving the superior endplate of L4 and the lesser trochanter of the proximal right femur. Suspicious activity posterior to the T3 and T4 vertebra may reflect epidural extension of tumor. This could be better assessed with thoracic spine MRI. 3. Atherosclerotic disease including multi vessel coronary artery  calcifications. Aneurysmal dilatation of the infrarenal abdominal aorta measures 3.5 cm.   Electronically Signed   By: Kerby Moors M.D.   On: 11/22/2013 14:45     EKG Interpretation None      MDM   Final diagnoses:  None    Patient with metastatic cancer. Recently diagnosed. Also found pathologic fracture of right lesser trochanter. Dr.Aluisio will see in consult tomorrow. Will admit to internal medicine for pain control and delineation of plan of treatment   Ovid Curd R. Alvino Chapel, MD 11/22/13 2227

## 2013-11-22 NOTE — Telephone Encounter (Signed)
Received message pt has had labs at referring MD. I left message with referring medical records to please fax the lab results. I called pt back no answer or voice mail. Nichole Werner is aware we need labs and will contact us with what labs were drawn.

## 2013-11-22 NOTE — ED Notes (Signed)
Pt brought in by family, daughter reports that oncologist read a PET scan done earlier today that indicated that she has a right hip fracture and he(oncolgist) recommended that pt be admitted.

## 2013-11-23 ENCOUNTER — Inpatient Hospital Stay (HOSPITAL_COMMUNITY): Payer: Medicare Other

## 2013-11-23 DIAGNOSIS — M949 Disorder of cartilage, unspecified: Secondary | ICD-10-CM

## 2013-11-23 DIAGNOSIS — Z87891 Personal history of nicotine dependence: Secondary | ICD-10-CM

## 2013-11-23 DIAGNOSIS — M899 Disorder of bone, unspecified: Secondary | ICD-10-CM

## 2013-11-23 DIAGNOSIS — M549 Dorsalgia, unspecified: Secondary | ICD-10-CM

## 2013-11-23 LAB — CBC
HCT: 32.8 % — ABNORMAL LOW (ref 36.0–46.0)
Hemoglobin: 10.5 g/dL — ABNORMAL LOW (ref 12.0–15.0)
MCH: 27.8 pg (ref 26.0–34.0)
MCHC: 32 g/dL (ref 30.0–36.0)
MCV: 86.8 fL (ref 78.0–100.0)
Platelets: 364 10*3/uL (ref 150–400)
RBC: 3.78 MIL/uL — ABNORMAL LOW (ref 3.87–5.11)
RDW: 14.1 % (ref 11.5–15.5)
WBC: 15.6 10*3/uL — ABNORMAL HIGH (ref 4.0–10.5)

## 2013-11-23 LAB — BASIC METABOLIC PANEL
BUN: 16 mg/dL (ref 6–23)
CO2: 27 meq/L (ref 19–32)
Calcium: 8.8 mg/dL (ref 8.4–10.5)
Chloride: 99 mEq/L (ref 96–112)
Creatinine, Ser: 0.85 mg/dL (ref 0.50–1.10)
GFR calc Af Amer: 75 mL/min — ABNORMAL LOW (ref >90)
GFR, EST NON AFRICAN AMERICAN: 64 mL/min — AB (ref >90)
GLUCOSE: 97 mg/dL (ref 70–99)
POTASSIUM: 4.5 meq/L (ref 3.7–5.3)
Sodium: 136 mEq/L — ABNORMAL LOW (ref 137–147)

## 2013-11-23 LAB — URINE MICROSCOPIC-ADD ON

## 2013-11-23 LAB — URINALYSIS, ROUTINE W REFLEX MICROSCOPIC
Bilirubin Urine: NEGATIVE
GLUCOSE, UA: NEGATIVE mg/dL
Ketones, ur: NEGATIVE mg/dL
Nitrite: NEGATIVE
Protein, ur: NEGATIVE mg/dL
SPECIFIC GRAVITY, URINE: 1.007 (ref 1.005–1.030)
Urobilinogen, UA: 1 mg/dL (ref 0.0–1.0)
pH: 7 (ref 5.0–8.0)

## 2013-11-23 LAB — PREALBUMIN: Prealbumin: 13.7 mg/dL — ABNORMAL LOW (ref 17.0–34.0)

## 2013-11-23 MED ORDER — ENOXAPARIN SODIUM 40 MG/0.4ML ~~LOC~~ SOLN
40.0000 mg | Freq: Every day | SUBCUTANEOUS | Status: DC
  Administered 2013-11-24 – 2013-12-02 (×8): 40 mg via SUBCUTANEOUS
  Filled 2013-11-23 (×11): qty 0.4

## 2013-11-23 MED ORDER — HYDRALAZINE HCL 20 MG/ML IJ SOLN
5.0000 mg | Freq: Four times a day (QID) | INTRAMUSCULAR | Status: DC | PRN
  Administered 2013-11-24 – 2013-11-28 (×8): 5 mg via INTRAVENOUS
  Filled 2013-11-23 (×7): qty 0.25

## 2013-11-23 MED ORDER — ZOLEDRONIC ACID 4 MG/5ML IV CONC: 4.0000 mg | Freq: Once | INTRAVENOUS | Status: DC

## 2013-11-23 MED ORDER — PAMIDRONATE DISODIUM 90 MG/10ML IV SOLN
90.0000 mg | Freq: Once | INTRAVENOUS | Status: AC
  Administered 2013-11-23: 90 mg via INTRAVENOUS
  Filled 2013-11-23: qty 10

## 2013-11-23 NOTE — Plan of Care (Signed)
Problem: Phase I Progression Outcomes Goal: Pre op Medical MD consult, if indicated Outcome: Progressing Pt has multiple health issues & may not be surgical candidate

## 2013-11-23 NOTE — Consult Note (Signed)
Reason for Consult: Hip fracture Referring Physician: Nicholaus Corolla Faeth is an 79 y.o. female.  HPI: Seen by Ramos LBP weight loss. MRI L4 fracture. Hip pain. Admitted for Metastatic CA. Difficulty ambulating prior.  Past Medical History  Diagnosis Date  . Hypertension   . OA (osteoarthritis)   . Hypothyroidism   . Undiagnosed cardiac murmurs   . Hx-TIA (transient ischemic attack)   . Clavicle fracture   . Skin cancer   . Bone cancer 11/20/13  . Lung cancer 11/22/13    Past Surgical History  Procedure Laterality Date  . Laminectomy  1973    with disc removal  . Abdominal hysterectomy    . Leg surgery      "rebuilt left leg"    Family History  Problem Relation Age of Onset  . Heart disease Sister     CABG  . Kidney failure Father   . Kidney failure Mother     Social History:  reports that she has been smoking.  She has never used smokeless tobacco. She reports that she does not drink alcohol or use illicit drugs.  Allergies:  Allergies  Allergen Reactions  . Benadryl [Diphenhydramine]     Speeds everything up  . Benzodiazepines     Doesn't know  . Contrast Media [Iodinated Diagnostic Agents]     Pt states it burns  . Morphine And Related     Keeps her awake  . Latex Rash    Medications: I have reviewed the patient's current medications.  Results for orders placed during the hospital encounter of 11/22/13 (from the past 48 hour(s))  CBC WITH DIFFERENTIAL     Status: Abnormal   Collection Time    11/22/13  8:45 PM      Result Value Ref Range   WBC 16.1 (*) 4.0 - 10.5 K/uL   RBC 3.89  3.87 - 5.11 MIL/uL   Hemoglobin 11.1 (*) 12.0 - 15.0 g/dL   HCT 33.5 (*) 36.0 - 46.0 %   MCV 86.1  78.0 - 100.0 fL   MCH 28.5  26.0 - 34.0 pg   MCHC 33.1  30.0 - 36.0 g/dL   RDW 14.3  11.5 - 15.5 %   Platelets 416 (*) 150 - 400 K/uL   Neutrophils Relative % 72  43 - 77 %   Neutro Abs 11.5 (*) 1.7 - 7.7 K/uL   Lymphocytes Relative 15  12 - 46 %   Lymphs Abs 2.4  0.7 -  4.0 K/uL   Monocytes Relative 10  3 - 12 %   Monocytes Absolute 1.6 (*) 0.1 - 1.0 K/uL   Eosinophils Relative 3  0 - 5 %   Eosinophils Absolute 0.5  0.0 - 0.7 K/uL   Basophils Relative 0  0 - 1 %   Basophils Absolute 0.1  0.0 - 0.1 K/uL  COMPREHENSIVE METABOLIC PANEL     Status: Abnormal   Collection Time    11/22/13  8:45 PM      Result Value Ref Range   Sodium 135 (*) 137 - 147 mEq/L   Potassium 4.1  3.7 - 5.3 mEq/L   Chloride 96  96 - 112 mEq/L   CO2 26  19 - 32 mEq/L   Glucose, Bld 102 (*) 70 - 99 mg/dL   BUN 20  6 - 23 mg/dL   Creatinine, Ser 0.94  0.50 - 1.10 mg/dL   Calcium 9.4  8.4 - 10.5 mg/dL   Total Protein 7.5  6.0 - 8.3 g/dL   Albumin 3.2 (*) 3.5 - 5.2 g/dL   AST 15  0 - 37 U/L   ALT 10  0 - 35 U/L   Alkaline Phosphatase 106  39 - 117 U/L   Total Bilirubin 0.4  0.3 - 1.2 mg/dL   GFR calc non Af Amer 57 (*) >90 mL/min   GFR calc Af Amer 66 (*) >90 mL/min   Comment: (NOTE)     The eGFR has been calculated using the CKD EPI equation.     This calculation has not been validated in all clinical situations.     eGFR's persistently <90 mL/min signify possible Chronic Kidney     Disease.  LACTATE DEHYDROGENASE     Status: Abnormal   Collection Time    11/22/13  8:45 PM      Result Value Ref Range   LDH 319 (*) 94 - 250 U/L  PREALBUMIN     Status: Abnormal   Collection Time    11/22/13  8:45 PM      Result Value Ref Range   Prealbumin 13.7 (*) 17.0 - 34.0 mg/dL   Comment: Performed at [facility]     Status: Abnormal   Collection Time    11/23/13  5:22 AM      Result Value Ref Range   Sodium 136 (*) 137 - 147 mEq/L   Potassium 4.5  3.7 - 5.3 mEq/L   Chloride 99  96 - 112 mEq/L   CO2 27  19 - 32 mEq/L   Glucose, Bld 97  70 - 99 mg/dL   BUN 16  6 - 23 mg/dL   Creatinine, Ser 0.85  0.50 - 1.10 mg/dL   Calcium 8.8  8.4 - 10.5 mg/dL   GFR calc non Af Amer 64 (*) >90 mL/min   GFR calc Af Amer 75 (*) >90 mL/min   Comment: (NOTE)      The eGFR has been calculated using the CKD EPI equation.     This calculation has not been validated in all clinical situations.     eGFR's persistently <90 mL/min signify possible Chronic Kidney     Disease.  CBC     Status: Abnormal   Collection Time    11/23/13  5:22 AM      Result Value Ref Range   WBC 15.6 (*) 4.0 - 10.5 K/uL   RBC 3.78 (*) 3.87 - 5.11 MIL/uL   Hemoglobin 10.5 (*) 12.0 - 15.0 g/dL   HCT 32.8 (*) 36.0 - 46.0 %   MCV 86.8  78.0 - 100.0 fL   MCH 27.8  26.0 - 34.0 pg   MCHC 32.0  30.0 - 36.0 g/dL   RDW 14.1  11.5 - 15.5 %   Platelets 364  150 - 400 K/uL    Ct Head W Wo Contrast  11/22/2013   CLINICAL DATA:  Right hip fracture, evaluation for intracranial metastasis.  EXAM: CT HEAD WITHOUT AND WITH CONTRAST  TECHNIQUE: Contiguous axial images were obtained from the base of the skull through the vertex without and with intravenous contrast  CONTRAST:  135m OMNIPAQUE IOHEXOL 300 MG/ML  SOLN  COMPARISON:  NM PET IMAGE INITIAL (PI) SKULL BASE TO THIGH dated 11/22/2013; DG LUMBAR SPINE COMPLETE dated 11/12/2013  FINDINGS: Moderate to severe ventriculomegaly, predominantly on the basis of global parenchymal brain volume loss, however there is mild effacement of the sulci at the convexities. No intraparenchymal hemorrhage, mass  effect nor midline shift. Prompt 1 supratentorial white matter hypodensities are non-specific suggest sequelae of chronic small vessel ischemic disease. No acute large vascular territory infarcts. Remote right basal ganglia cystic lacunar infarct in addition to tiny hypodensities in bilateral basal ganglia and thalamus suggesting remote ischemia.  No abnormal extra-axial fluid collections. Basal cisterns are patent. Moderate calcific atherosclerosis of the carotid siphons.  No skull fracture. Subcentimeter lucency in the right occipital calvarium, axial 15/29. The included ocular globes and orbital contents are non-suspicious ; the elongated symmetric ocular  globes may reflect myopia for which ophthalmological examination could be considered. The mastoid aircells and included paranasal sinuses are well-aerated.  IMPRESSION: No acute intracranial process ; no CT findings of intracranial metastasis though, MRI of the brain with contrast would be more sensitive. However, subcentimeter lucency in right occipital calvarium, this nonspecific, and metastasis is not excluded (should MRI of the brain be performed, recommend fat saturation post gad imaging).  Moderate to severe global brain atrophy, with disproportionate sulcal effacement of the convexities which can be assessed with normal pressure hydrocephalus.  Moderate to severe white matter changes suggest chronic small vessel ischemic disease with remote thalamic and basal ganglia lacunar infarcts.   Electronically Signed   By: Elon Alas   On: 11/22/2013 22:23   Ct Chest W Contrast  11/22/2013   CLINICAL DATA:  Right hip fracture, suspected lung malignancy and bony metastases  EXAM: CT CHEST, ABDOMEN, AND PELVIS WITH CONTRAST  TECHNIQUE: Multidetector CT imaging of the chest, abdomen and pelvis was performed following the standard protocol during bolus administration of intravenous contrast.  CONTRAST:  143m OMNIPAQUE IOHEXOL 300 MG/ML  SOLN  COMPARISON:  PET-CT study of today's date  FINDINGS: CT CHEST FINDINGS  At lung window settings there are emphysematous changes bilaterally. There is an abnormal 1.3 cm diameter spiculated density anterior to the superior vena cava in the superior medial aspect of the right upper lobe. No pulmonary parenchymal masses are demonstrated elsewhere. There is compressive atelectasis posteriorly, bilaterally.  At mediastinal window settings the cardiac chambers are mildly enlarged. There are coronary artery calcifications present. The caliber of the ascending thoracic aorta and aortic arch is normal. There is mild failure taper of the caliber of the descending thoracic aorta  which measures a maximum approximately 3.2 cm. The study was not tailored for pulmonary arterial tree evaluation. The patient has known hypermetabolic mediastinal and right hilar lymph nodes.  There is compression of the body of T3 amounting to approximately 50% and T11 amounting to approximately 15%. The T3 region was noted to be hypermetabolic on the PET scan with findings suspicious for surrounding tumor. No retropulsed bony fragments are demonstrated on the CT images.  CT ABDOMEN AND PELVIS FINDINGS  The liver exhibits mild to moderate ductal dilation. The gallbladder is adequately distended and contains calcified and gas-containing stones. The spleen is normal in size and exhibits punctate calcifications. The pancreas is normal in density and contour. The partially contrast filled loops of small and large bowel exhibit no acute abnormalities. The kidneys exhibit no evidence of obstruction. There is a 2.6 x 1.6 hypodensity in the midpole of the left kidney with Hounsfield measurement of +6 immediately post contrast and 0 on delayed images. This likely reflects a cyst. . The distended urinary bladder is normal in appearance. There is an aneurysm of the suprarenal portion of the abdominal aorta which contains considerable mural thrombus. It measures approximately 3.6 cm in diameter and extends for a length of  approximately 5.6 cm.  The uterus is surgically absent. There is no adnexal mass. There is no free intra-abdominal or pelvic fluid. There is no inguinal or umbilical hernia.  There is partial compression of the body of L4 with loss of height of approximately 50%. The bony pelvis exhibits no acute fracture. There is known metastatic disease involving the central portion of the sacrum which is manifested by mildly increased density within the marrow space. There is a fracture involving the lesser trochanter of the right hip with surrounding lytic changes. The left hip appears intact.  IMPRESSION: 1. There is a  small spiculated mass medially in the right upper lobe which is known to be hypermetabolic and worrisome for malignancy. 2. There are multiples abnormalities within the skeleton as described consistent with metastatic disease. There is a fracture involving the lesser trochanter of the right femur with surrounding destructive changes. This demonstrated abnormal uptake on today's PET CT study. Similar abnormality without evidence of fracture is demonstrated in the upper sacral body. 3. There is a suprarenal abdominal aortic aneurysm without evidence of leakage. 4. There are gallstones present. There is intrahepatic ductal dilation. No pancreatic mass is demonstrated.   Electronically Signed   By: David  Martinique   On: 11/22/2013 22:45   Ct Abdomen Pelvis W Contrast  11/22/2013   CLINICAL DATA:  Right hip fracture, suspected lung malignancy and bony metastases  EXAM: CT CHEST, ABDOMEN, AND PELVIS WITH CONTRAST  TECHNIQUE: Multidetector CT imaging of the chest, abdomen and pelvis was performed following the standard protocol during bolus administration of intravenous contrast.  CONTRAST:  129m OMNIPAQUE IOHEXOL 300 MG/ML  SOLN  COMPARISON:  PET-CT study of today's date  FINDINGS: CT CHEST FINDINGS  At lung window settings there are emphysematous changes bilaterally. There is an abnormal 1.3 cm diameter spiculated density anterior to the superior vena cava in the superior medial aspect of the right upper lobe. No pulmonary parenchymal masses are demonstrated elsewhere. There is compressive atelectasis posteriorly, bilaterally.  At mediastinal window settings the cardiac chambers are mildly enlarged. There are coronary artery calcifications present. The caliber of the ascending thoracic aorta and aortic arch is normal. There is mild failure taper of the caliber of the descending thoracic aorta which measures a maximum approximately 3.2 cm. The study was not tailored for pulmonary arterial tree evaluation. The patient  has known hypermetabolic mediastinal and right hilar lymph nodes.  There is compression of the body of T3 amounting to approximately 50% and T11 amounting to approximately 15%. The T3 region was noted to be hypermetabolic on the PET scan with findings suspicious for surrounding tumor. No retropulsed bony fragments are demonstrated on the CT images.  CT ABDOMEN AND PELVIS FINDINGS  The liver exhibits mild to moderate ductal dilation. The gallbladder is adequately distended and contains calcified and gas-containing stones. The spleen is normal in size and exhibits punctate calcifications. The pancreas is normal in density and contour. The partially contrast filled loops of small and large bowel exhibit no acute abnormalities. The kidneys exhibit no evidence of obstruction. There is a 2.6 x 1.6 hypodensity in the midpole of the left kidney with Hounsfield measurement of +6 immediately post contrast and 0 on delayed images. This likely reflects a cyst. . The distended urinary bladder is normal in appearance. There is an aneurysm of the suprarenal portion of the abdominal aorta which contains considerable mural thrombus. It measures approximately 3.6 cm in diameter and extends for a length of approximately 5.6 cm.  The uterus is surgically absent. There is no adnexal mass. There is no free intra-abdominal or pelvic fluid. There is no inguinal or umbilical hernia.  There is partial compression of the body of L4 with loss of height of approximately 50%. The bony pelvis exhibits no acute fracture. There is known metastatic disease involving the central portion of the sacrum which is manifested by mildly increased density within the marrow space. There is a fracture involving the lesser trochanter of the right hip with surrounding lytic changes. The left hip appears intact.  IMPRESSION: 1. There is a small spiculated mass medially in the right upper lobe which is known to be hypermetabolic and worrisome for malignancy. 2.  There are multiples abnormalities within the skeleton as described consistent with metastatic disease. There is a fracture involving the lesser trochanter of the right femur with surrounding destructive changes. This demonstrated abnormal uptake on today's PET CT study. Similar abnormality without evidence of fracture is demonstrated in the upper sacral body. 3. There is a suprarenal abdominal aortic aneurysm without evidence of leakage. 4. There are gallstones present. There is intrahepatic ductal dilation. No pancreatic mass is demonstrated.   Electronically Signed   By: David  Martinique   On: 11/22/2013 22:45   Nm Pet Image Initial (pi) Skull Base To Thigh  11/22/2013   CLINICAL DATA:  Initial treatment strategy for Lung cancer.  EXAM: NUCLEAR MEDICINE PET SKULL BASE TO THIGH  TECHNIQUE: 8.8 mCi F-18 FDG was injected intravenously. Full-ring PET imaging was performed from the skull base to thigh after the radiotracer. CT data was obtained and used for attenuation correction and anatomic localization.  FASTING BLOOD GLUCOSE:  Value: 110 mg/dl  COMPARISON:  None.  FINDINGS: NECK  No hypermetabolic lymph nodes in the neck.  CHEST  Moderate changes of centrilobular emphysema identified. The pulmonary nodule within the right upper lobe measures 1.3 cm and has an SUV max equal to 6.3. Calcified granuloma is identified within the left lower lobe.  Normal heart size. No pericardial effusion. There is calcified atherosclerotic disease involving the thoracic aorta. Calcifications also involve the LAD, left circumflex and RCA coronary arteries. Hypermetabolic right hilar lymph node has an SUV max equal to 3.9. Calcified left sided mediastinal and left hilar lymph nodes are identified.  ABDOMEN/PELVIS  Multiple stones are identified within the gallbladder. The pancreas is within normal limits. Calcified granulomas identified within the spleen.  The adrenal glands are both normal. Left renal cyst is identified. This is  incompletely characterized without IV contrast. No hypermetabolic upper abdominal or pelvic lymph nodes identified.  Calcified atherosclerotic disease involves the abdominal aorta. The aorta has a maximum AP dimension of 3.5 cm.  SKELETON  Multifocal hypermetabolic bone metastases are identified. Metastasis within the central portion of the sacrum measure 4.5 cm and has an SUV max equal to 14.8. There is a metastasis with pathologic fracture involving the lesser trochanter of the proximal right femur. This has an SUV max equal to 13.8. Tumor involving the right side of the C1 vertebra has an SUV max equal to 13.7. There is tumor involving the L4 vertebra with associated superior endplate fracture deformity. At T3 and T4 tumor appears to extend posteriorly into the epidural space.  IMPRESSION: 1. Examination is positive for hypermetabolic tumor within the right upper lobe with associated ipsilateral hilar lymph node metastasis. 2. Multifocal bone metastasis involving the axial and proximal appendicular skeleton. There are associated pathologic fractures involving the superior endplate of L4 and the lesser  trochanter of the proximal right femur. Suspicious activity posterior to the T3 and T4 vertebra may reflect epidural extension of tumor. This could be better assessed with thoracic spine MRI. 3. Atherosclerotic disease including multi vessel coronary artery calcifications. Aneurysmal dilatation of the infrarenal abdominal aorta measures 3.5 cm.   Electronically Signed   By: Kerby Moors M.D.   On: 11/22/2013 14:45    Review of Systems  Constitutional: Positive for malaise/fatigue.  HENT: Negative.   Eyes: Negative.   Respiratory: Negative.   Cardiovascular: Negative.   Gastrointestinal: Negative.   Genitourinary: Negative.   Musculoskeletal: Positive for back pain.  Skin: Negative.   Neurological: Positive for focal weakness.  Endo/Heme/Allergies: Negative.   Psychiatric/Behavioral: Negative.     Blood pressure 173/90, pulse 74, temperature 98.4 F (36.9 C), temperature source Oral, resp. rate 18, height 5' 4"  (1.626 m), weight 67.132 kg (148 lb), SpO2 95.00%. Physical Exam  Constitutional: She is oriented to person, place, and time. She appears well-developed.  HENT:  Head: Normocephalic.  Eyes: Pupils are equal, round, and reactive to light.  Neck: Normal range of motion. Neck supple.  Cardiovascular: Regular rhythm.   Respiratory: Breath sounds normal.  GI: Soft.  Musculoskeletal:  Motor grossly 5/5 LE. No DVT. No babinski or clonus. Pulses intact. Hyporeflexic.  Neurological: She is alert and oriented to person, place, and time.  Skin: Skin is warm and dry.  Psychiatric: She has a normal mood and affect.    Assessment/Plan: Metastatic Lung CA with multiple pathologic fractures. Recommend Xrays right hip. Also MRI with contrast of T-L spine to evaluate for extraosseous tumor T4 and L4.and extent of canal involvement. Discussed with radiology Bedrest and log rolls for now.  DVT prophylaxis. Foley. ISB Dr. Maureen Ralphs to See Monday.  Nichole Werner 11/23/2013, 8:50 AM

## 2013-11-23 NOTE — Consult Note (Signed)
#   [id number] is consult note.    She does NOT want any aggressive therapy for this.  I do think that surgical repair of a hip fracture might be reasonable if with any surgery feels this is necessary. Also think that kyphoplasty at L4 would be reasonable. I believe that radiation therapy would be a very good way of trying to help with her pain and quality of life.  A tissue diagnosis I believe it is important. This could be done with a percutaneous biopsy. This also could be done if she has surgery or kyphoplasty.  I did have a long talk with her. She was very cognizant and coherent. She does not want aggressive measures or life support. I agree with this. Her pre-albumin is already low.  She is a NO CODE BLUE.  She also will need Zometa or Aredia for the bone metastases.  We will follow her along.  I think the hospitalist and orthopedic surgeons so much for seeing her.  We had an excellent prayer session.  Pete E.  Hebrews 12:12

## 2013-11-23 NOTE — Progress Notes (Signed)
[facility] PROGRESS NOTE  Nichole Werner ZGY:[id number] DOB: 1934/10/19 DOA: 11/22/2013 PCP: Gavin Pound, MD Interim summary: Nichole Werner is a 79 y.o. female with recent diagnosis of Metastatic Lung Cancer who was sent for a Pet Scan this Afternoon which returned revealing diffuse metastatic Disease and a RUL Lung Tumor, as well as a Pathologic Fracture of the Right Hip. She had been having increased low back pain for several weeks and was sent for an MRI by Dr. Nelva Bush who interpreted a neoplastic process of the L4 Compression fracture which was found. She report having right hip pain for the past 4 days and she was sent for an PET scan today by Dr. Marin Olp, the results of which revealed a pathologic fracture of the right hip. She was referred tot he ED. She denies any history of trauma or fall. She denies having fevers chills or night sweats, she does report having a loss of appetite over the past few months and an unintentional weight loss of 20 pounds over the past 6 months.     Assessment/Plan: 1. Pathological fracture of the right hip: - secondary to metastatic disease. Orthopedics consulted.  - pain control and conservative management as per discussions.   2. Metastatic lung cancer; - Dr Marin Olp on board and appreciate his recommendations.  - she doesn't want any aggressive measures at this time.  - we have changed her advanced directives to DNR as per her wishes.   3. UTI: - cultures pending.  - on rocephin.   5. HTN- continue Lisinopril, and Monitor BPs.  6. Hypothyroid- Check TSH level and continue Levothyroxine, and adjust PRN.  7. Leukocytosis- Reactive due to #1,#2, #3, and #4. Monitor Trend.  8. Thrombocytosis- Reactive due to #1, #2, #3, and #4. Monitor Trend.  9. Tobacco Use Disorder- Nicotine Patch daily.  10. Compression fracture of the l4: - MRI of the T/l spine pending.     Code Status: full code Family Communication: none at bedside Disposition Plan:  pending .    Consultants:  Ortho  oncology  Procedures:  MRI T/L spine.   Antibiotics:  Rocephin for UTI.   HPI/Subjective: Comfortable, just received pain medications. Denies any new complaints.   Objective: Filed Vitals:   11/23/13 1040  BP: 182/92  Pulse:   Temp:   Resp:     Intake/Output Summary (Last 24 hours) at 11/23/13 1411 Last data filed at 11/23/13 1002  Gross per 24 hour  Intake  557.5 ml  Output      0 ml  Net  557.5 ml   Filed Weights   11/23/13 0000  Weight: 67.132 kg (148 lb)    Exam:   General:  sleepy  Cardiovascular: s1s2  Respiratory: ctab  Abdomen: soft NT ND BS+  Musculoskeletal: painful ROM of the LE, no pedal edema.   Data Reviewed: Basic Metabolic Panel:  Recent Labs Lab 11/22/13 2045 11/23/13 0522  NA 135* 136*  K 4.1 4.5  CL 96 99  CO2 26 27  GLUCOSE 102* 97  BUN 20 16  CREATININE 0.94 0.85  CALCIUM 9.4 8.8   Liver Function Tests:  Recent Labs Lab 11/22/13 2045  AST 15  ALT 10  ALKPHOS 106  BILITOT 0.4  PROT 7.5  ALBUMIN 3.2*   No results found for this basename: LIPASE, AMYLASE,  in the last 168 hours No results found for this basename: AMMONIA,  in the last 168 hours CBC:  Recent Labs Lab 11/22/13 2045 11/23/13 0522  WBC 16.1* 15.6*  NEUTROABS 11.5*  --   HGB 11.1* 10.5*  HCT 33.5* 32.8*  MCV 86.1 86.8  PLT 416* 364   Cardiac Enzymes: No results found for this basename: CKTOTAL, CKMB, CKMBINDEX, TROPONINI,  in the last 168 hours BNP (last 3 results) No results found for this basename: PROBNP,  in the last 8760 hours CBG:  Recent Labs Lab 11/22/13 1102  GLUCAP 110*    No results found for this or any previous visit (from the past 240 hour(s)).   Studies: Dg Hip Complete Right  11/23/2013   CLINICAL DATA:  Right lesser trochanter fracture, right hip pain  EXAM: RIGHT HIP - COMPLETE 2+ VIEW  COMPARISON:  No comparisons  FINDINGS: Iliac spurring is noted. Right lesser trochanter  fracture is identified with mild distraction. Bowel material and contrast partly obscure the pelvis. No displaced pelvic fracture identified. Lumbar spine disc degenerative change partly visualized.  IMPRESSION: Right lesser trochanter fracture.   Electronically Signed   By: Conchita Paris M.D.   On: 11/23/2013 11:35   Ct Head W Wo Contrast  11/22/2013   CLINICAL DATA:  Right hip fracture, evaluation for intracranial metastasis.  EXAM: CT HEAD WITHOUT AND WITH CONTRAST  TECHNIQUE: Contiguous axial images were obtained from the base of the skull through the vertex without and with intravenous contrast  CONTRAST:  137mL OMNIPAQUE IOHEXOL 300 MG/ML  SOLN  COMPARISON:  NM PET IMAGE INITIAL (PI) SKULL BASE TO THIGH dated 11/22/2013; DG LUMBAR SPINE COMPLETE dated 11/12/2013  FINDINGS: Moderate to severe ventriculomegaly, predominantly on the basis of global parenchymal brain volume loss, however there is mild effacement of the sulci at the convexities. No intraparenchymal hemorrhage, mass effect nor midline shift. Prompt 1 supratentorial white matter hypodensities are non-specific suggest sequelae of chronic small vessel ischemic disease. No acute large vascular territory infarcts. Remote right basal ganglia cystic lacunar infarct in addition to tiny hypodensities in bilateral basal ganglia and thalamus suggesting remote ischemia.  No abnormal extra-axial fluid collections. Basal cisterns are patent. Moderate calcific atherosclerosis of the carotid siphons.  No skull fracture. Subcentimeter lucency in the right occipital calvarium, axial 15/29. The included ocular globes and orbital contents are non-suspicious ; the elongated symmetric ocular globes may reflect myopia for which ophthalmological examination could be considered. The mastoid aircells and included paranasal sinuses are well-aerated.  IMPRESSION: No acute intracranial process ; no CT findings of intracranial metastasis though, MRI of the brain with contrast  would be more sensitive. However, subcentimeter lucency in right occipital calvarium, this nonspecific, and metastasis is not excluded (should MRI of the brain be performed, recommend fat saturation post gad imaging).  Moderate to severe global brain atrophy, with disproportionate sulcal effacement of the convexities which can be assessed with normal pressure hydrocephalus.  Moderate to severe white matter changes suggest chronic small vessel ischemic disease with remote thalamic and basal ganglia lacunar infarcts.   Electronically Signed   By: Elon Alas   On: 11/22/2013 22:23   Ct Chest W Contrast  11/22/2013   CLINICAL DATA:  Right hip fracture, suspected lung malignancy and bony metastases  EXAM: CT CHEST, ABDOMEN, AND PELVIS WITH CONTRAST  TECHNIQUE: Multidetector CT imaging of the chest, abdomen and pelvis was performed following the standard protocol during bolus administration of intravenous contrast.  CONTRAST:  125mL OMNIPAQUE IOHEXOL 300 MG/ML  SOLN  COMPARISON:  PET-CT study of today's date  FINDINGS: CT CHEST FINDINGS  At lung window settings there are emphysematous changes bilaterally.  There is an abnormal 1.3 cm diameter spiculated density anterior to the superior vena cava in the superior medial aspect of the right upper lobe. No pulmonary parenchymal masses are demonstrated elsewhere. There is compressive atelectasis posteriorly, bilaterally.  At mediastinal window settings the cardiac chambers are mildly enlarged. There are coronary artery calcifications present. The caliber of the ascending thoracic aorta and aortic arch is normal. There is mild failure taper of the caliber of the descending thoracic aorta which measures a maximum approximately 3.2 cm. The study was not tailored for pulmonary arterial tree evaluation. The patient has known hypermetabolic mediastinal and right hilar lymph nodes.  There is compression of the body of T3 amounting to approximately 50% and T11 amounting to  approximately 15%. The T3 region was noted to be hypermetabolic on the PET scan with findings suspicious for surrounding tumor. No retropulsed bony fragments are demonstrated on the CT images.  CT ABDOMEN AND PELVIS FINDINGS  The liver exhibits mild to moderate ductal dilation. The gallbladder is adequately distended and contains calcified and gas-containing stones. The spleen is normal in size and exhibits punctate calcifications. The pancreas is normal in density and contour. The partially contrast filled loops of small and large bowel exhibit no acute abnormalities. The kidneys exhibit no evidence of obstruction. There is a 2.6 x 1.6 hypodensity in the midpole of the left kidney with Hounsfield measurement of +6 immediately post contrast and 0 on delayed images. This likely reflects a cyst. . The distended urinary bladder is normal in appearance. There is an aneurysm of the suprarenal portion of the abdominal aorta which contains considerable mural thrombus. It measures approximately 3.6 cm in diameter and extends for a length of approximately 5.6 cm.  The uterus is surgically absent. There is no adnexal mass. There is no free intra-abdominal or pelvic fluid. There is no inguinal or umbilical hernia.  There is partial compression of the body of L4 with loss of height of approximately 50%. The bony pelvis exhibits no acute fracture. There is known metastatic disease involving the central portion of the sacrum which is manifested by mildly increased density within the marrow space. There is a fracture involving the lesser trochanter of the right hip with surrounding lytic changes. The left hip appears intact.  IMPRESSION: 1. There is a small spiculated mass medially in the right upper lobe which is known to be hypermetabolic and worrisome for malignancy. 2. There are multiples abnormalities within the skeleton as described consistent with metastatic disease. There is a fracture involving the lesser trochanter of  the right femur with surrounding destructive changes. This demonstrated abnormal uptake on today's PET CT study. Similar abnormality without evidence of fracture is demonstrated in the upper sacral body. 3. There is a suprarenal abdominal aortic aneurysm without evidence of leakage. 4. There are gallstones present. There is intrahepatic ductal dilation. No pancreatic mass is demonstrated.   Electronically Signed   By: David  Martinique   On: 11/22/2013 22:45   Ct Abdomen Pelvis W Contrast  11/22/2013   CLINICAL DATA:  Right hip fracture, suspected lung malignancy and bony metastases  EXAM: CT CHEST, ABDOMEN, AND PELVIS WITH CONTRAST  TECHNIQUE: Multidetector CT imaging of the chest, abdomen and pelvis was performed following the standard protocol during bolus administration of intravenous contrast.  CONTRAST:  169mL OMNIPAQUE IOHEXOL 300 MG/ML  SOLN  COMPARISON:  PET-CT study of today's date  FINDINGS: CT CHEST FINDINGS  At lung window settings there are emphysematous changes bilaterally. There is an  abnormal 1.3 cm diameter spiculated density anterior to the superior vena cava in the superior medial aspect of the right upper lobe. No pulmonary parenchymal masses are demonstrated elsewhere. There is compressive atelectasis posteriorly, bilaterally.  At mediastinal window settings the cardiac chambers are mildly enlarged. There are coronary artery calcifications present. The caliber of the ascending thoracic aorta and aortic arch is normal. There is mild failure taper of the caliber of the descending thoracic aorta which measures a maximum approximately 3.2 cm. The study was not tailored for pulmonary arterial tree evaluation. The patient has known hypermetabolic mediastinal and right hilar lymph nodes.  There is compression of the body of T3 amounting to approximately 50% and T11 amounting to approximately 15%. The T3 region was noted to be hypermetabolic on the PET scan with findings suspicious for surrounding  tumor. No retropulsed bony fragments are demonstrated on the CT images.  CT ABDOMEN AND PELVIS FINDINGS  The liver exhibits mild to moderate ductal dilation. The gallbladder is adequately distended and contains calcified and gas-containing stones. The spleen is normal in size and exhibits punctate calcifications. The pancreas is normal in density and contour. The partially contrast filled loops of small and large bowel exhibit no acute abnormalities. The kidneys exhibit no evidence of obstruction. There is a 2.6 x 1.6 hypodensity in the midpole of the left kidney with Hounsfield measurement of +6 immediately post contrast and 0 on delayed images. This likely reflects a cyst. . The distended urinary bladder is normal in appearance. There is an aneurysm of the suprarenal portion of the abdominal aorta which contains considerable mural thrombus. It measures approximately 3.6 cm in diameter and extends for a length of approximately 5.6 cm.  The uterus is surgically absent. There is no adnexal mass. There is no free intra-abdominal or pelvic fluid. There is no inguinal or umbilical hernia.  There is partial compression of the body of L4 with loss of height of approximately 50%. The bony pelvis exhibits no acute fracture. There is known metastatic disease involving the central portion of the sacrum which is manifested by mildly increased density within the marrow space. There is a fracture involving the lesser trochanter of the right hip with surrounding lytic changes. The left hip appears intact.  IMPRESSION: 1. There is a small spiculated mass medially in the right upper lobe which is known to be hypermetabolic and worrisome for malignancy. 2. There are multiples abnormalities within the skeleton as described consistent with metastatic disease. There is a fracture involving the lesser trochanter of the right femur with surrounding destructive changes. This demonstrated abnormal uptake on today's PET CT study. Similar  abnormality without evidence of fracture is demonstrated in the upper sacral body. 3. There is a suprarenal abdominal aortic aneurysm without evidence of leakage. 4. There are gallstones present. There is intrahepatic ductal dilation. No pancreatic mass is demonstrated.   Electronically Signed   By: David  Martinique   On: 11/22/2013 22:45   Nm Pet Image Initial (pi) Skull Base To Thigh  11/22/2013   CLINICAL DATA:  Initial treatment strategy for Lung cancer.  EXAM: NUCLEAR MEDICINE PET SKULL BASE TO THIGH  TECHNIQUE: 8.8 mCi F-18 FDG was injected intravenously. Full-ring PET imaging was performed from the skull base to thigh after the radiotracer. CT data was obtained and used for attenuation correction and anatomic localization.  FASTING BLOOD GLUCOSE:  Value: 110 mg/dl  COMPARISON:  None.  FINDINGS: NECK  No hypermetabolic lymph nodes in the neck.  CHEST  Moderate changes of centrilobular emphysema identified. The pulmonary nodule within the right upper lobe measures 1.3 cm and has an SUV max equal to 6.3. Calcified granuloma is identified within the left lower lobe.  Normal heart size. No pericardial effusion. There is calcified atherosclerotic disease involving the thoracic aorta. Calcifications also involve the LAD, left circumflex and RCA coronary arteries. Hypermetabolic right hilar lymph node has an SUV max equal to 3.9. Calcified left sided mediastinal and left hilar lymph nodes are identified.  ABDOMEN/PELVIS  Multiple stones are identified within the gallbladder. The pancreas is within normal limits. Calcified granulomas identified within the spleen.  The adrenal glands are both normal. Left renal cyst is identified. This is incompletely characterized without IV contrast. No hypermetabolic upper abdominal or pelvic lymph nodes identified.  Calcified atherosclerotic disease involves the abdominal aorta. The aorta has a maximum AP dimension of 3.5 cm.  SKELETON  Multifocal hypermetabolic bone metastases are  identified. Metastasis within the central portion of the sacrum measure 4.5 cm and has an SUV max equal to 14.8. There is a metastasis with pathologic fracture involving the lesser trochanter of the proximal right femur. This has an SUV max equal to 13.8. Tumor involving the right side of the C1 vertebra has an SUV max equal to 13.7. There is tumor involving the L4 vertebra with associated superior endplate fracture deformity. At T3 and T4 tumor appears to extend posteriorly into the epidural space.  IMPRESSION: 1. Examination is positive for hypermetabolic tumor within the right upper lobe with associated ipsilateral hilar lymph node metastasis. 2. Multifocal bone metastasis involving the axial and proximal appendicular skeleton. There are associated pathologic fractures involving the superior endplate of L4 and the lesser trochanter of the proximal right femur. Suspicious activity posterior to the T3 and T4 vertebra may reflect epidural extension of tumor. This could be better assessed with thoracic spine MRI. 3. Atherosclerotic disease including multi vessel coronary artery calcifications. Aneurysmal dilatation of the infrarenal abdominal aorta measures 3.5 cm.   Electronically Signed   By: Kerby Moors M.D.   On: 11/22/2013 14:45    Scheduled Meds: . beta carotene w/minerals  1 tablet Oral Daily  . cefTRIAXone (ROCEPHIN)  IV  1 g Intravenous QHS  . enoxaparin (LOVENOX) injection  40 mg Subcutaneous QHS  . levothyroxine  50 mcg Oral QAC breakfast  . lisinopril  10 mg Oral Daily  . metoprolol succinate  100 mg Oral Daily  . nicotine  14 mg Transdermal Daily  . pamidronate  90 mg Intravenous Once   Continuous Infusions: . sodium chloride 75 mL/hr at 11/23/13 0002    Principal Problem:   Pathologic fracture Active Problems:   Mixed hyperlipidemia   Tobacco use disorder   Essential hypertension, benign   Metastatic lung cancer (metastasis from lung to other site)   Compression fracture of L4  lumbar vertebra   UTI (lower urinary tract infection)   Hypothyroidism   Leukocytosis   Thrombocytosis    Time spent: 25 minutes.     Hosie Poisson  [facility] Pager [phone]. If 7PM-7AM, please contact night-coverage at [email or web address], password [facility] 11/23/2013, 2:11 PM  LOS: 1 day

## 2013-11-23 NOTE — Consult Note (Cosign Needed)
NAME:  Nichole Werner, Nichole Werner NO.:  [id number]  MEDICAL RECORD NO.:  [id number]  LOCATION:  8469                         FACILITY:  [facility]  PHYSICIAN:  Nichole Werner, M.D.  DATE OF BIRTH:  03/24/1935  DATE OF CONSULTATION:  11/23/2013 DATE OF DISCHARGE:                                CONSULTATION   REFERRING PHYSICIAN:  Hosie Poisson, MD  REASON FOR CONSULTATION:  Metastatic bronchogenic carcinoma of the right lung.  HISTORY OF PRESENT ILLNESS:  Nichole Werner is a very charming 79 year old, white female.  She is the mother-in-law of one of our church members.  She was found have a pathologic fracture at L4.  She saw an Orthopedic surgeon because of back pain.  They had her set up for a bone scan in 2 weeks.  He called me couple days ago.  I felt we had to remove this evaluation along.  She unfortunately worsened with pain.  She was subsequently admitted. She had a PET scan done on the Werner of admission.  The PET scan showed a right upper lung nodule where that was very intense in uptake.  She had adenopathy in the right hilum.  She had multiple bony lesions.  She had a pathologic fracture of the lesser trochanter of the right femur.  She had a tumor involving the right C1 vertebrae.  This tumor involving L4. Basically, she had a tumor in quite a bit of bones.  She has lost quite a bit of weight.  She is not sure how much weight she has lost but there has been a weight loss.  She has had CAT scans.  CT of the brain showed no intracranial process. She had some mild occipital calvarial lesions that were nonspecific. She had modest to severe global brain atrophy.  There may be some hydrocephalus that is normal pressure hydrocephalus.  She has had thalamic and lacunar infarcts.  CT of the chest, abdomen, and pelvis shows an 1.3 cm spiculated density in the right upper lung.  She has mediastinal adenopathy.  There are compression fractures at T3 and T11. She has no  liver metastases.  She does have an abdominal aortic aneurysm.  She has 50% L4 compression fracture.  She has multiple skeletal abnormalities consistent with metastatic disease.  She had a right hip x-ray.  The report is not yet out.  She does not report a lot of pain.  However, she is admitted because she was almost bed-bound.  She has been seen by Orthopedic surgery.  They are awaiting determination for any kind of surgical procedure.  Her laboratory studies on admission, showed a pre-albumin of 13.7.  LDH was 319.  White cell count 16.  Calcium 9.4 with an albumin of 3.2.  We are asked to see her for oncologic evaluation.  She stated that she does not want any aggressive therapy.  I can understand this.  She has had no cough.  She has been a longtime smoker.  There has been no hemoptysis.  Her appetite has been down a little bit.  PAST MEDICAL HISTORY:  Remarkable for, 1. Hypothyroidism. 2. Hypertension. 3. TIAs. 4. Heart murmur. 5. History of past  laminectomy. 6. History of hysterectomy.  ALLERGIES: 1. BENADRYL. 2. BENZODIAZEPINES. 3. IV CONTRAST. 4. MORPHINE. 5. LASIX.  ADMISSION MEDICATIONS: 1. Synthroid 0.05 mg p.o. daily. 2. Lisinopril 10 mg p.o. daily. 3. Metoprolol XL 100 mg p.o. daily. 4. MiraLax as needed. 5. Keflex __________ mg p.o. t.i.d.  SOCIAL HISTORY:  Remarkable for tobacco use.  She past smoked for about 50 years or so.  There is no alcohol use.  FAMILY HISTORY:  Unremarkable for any obvious cancer.  PHYSICAL EXAMINATION:  GENERAL:  This is an elderly white female, in no obvious distress.  She is alert and oriented. VITAL SIGNS:  Temperature 98.3.  Pulse is 77.  Blood pressure 182/92. HEAD AND NECK:  No ocular or oral lesions.  She has no palpable cervical or supraclavicular lymph nodes. LUNGS:  Clear bilaterally. CARDIAC:  Regular rate and rhythm with a normal S1, S2.  She has a 2/6 systolic ejection murmur. ABDOMEN:  Soft.  She has  good bowel sounds.  There is no fluid wave. There is no palpable abdominal mass.  There is no palpable hepatosplenomegaly. BACK:  No tenderness over the spine. EXTREMITIES:  No clubbing, cyanosis, or edema. NEUROLOGIC:  No focal neurological deficits.  IMPRESSION:  Nichole Werner is a very charming 79 year old white female with obvious metastatic bronchogenic carcinoma.  We do not have a pathologic diagnosis yet.  I would have to suspect that this will be a non-small cell lung cancer.  I spoke to her at length.  She does not want any chemotherapy.  She does not want any aggressive intervention.  I certainly can understand this.  I do think that radiation therapy would be helpful.  I think that for the L4 fracture, 1 might consider kyphoplasty for this to help strengthening to help with pain.  I think that the right hip needs surgery, then I will probably would try to do this.  I think this would be some of that would help her quality of life.  We are in a palliative care mode.  Again, I agree with this.  Again, I do think that getting a tissue diagnosis would be helpful.  How to do this I think we will depend on any surgical procedure.  She will need Zometa or Aredia.  I think this is necessary to help with the bones.  I did speak to her about code status.  She does not want aggressive measures for life support.  I agree with this.  As such, we will change her code status to no code blue.  We will follow along.  Next week, we may take over as primary physicians depend on how the workup goes.  We did have a great prayer session.  She has a very strong faith.  She knows where she is going and is not worried about this.     Nichole Werner, M.D.     PRE/MEDQ  D:  11/23/2013  T:  11/23/2013  Job:  [id number]  cc:   Nichole Pound, MD Fax: [phone]  Nichole Werner, M.D. Fax: [phone]

## 2013-11-24 ENCOUNTER — Other Ambulatory Visit (HOSPITAL_COMMUNITY): Payer: No Typology Code available for payment source

## 2013-11-24 NOTE — Progress Notes (Signed)
Pt restless throughout night. Disoriented to time and place intermittently. Reoriented easily.

## 2013-11-24 NOTE — Progress Notes (Signed)
Pt refuses to have vital signs done, pulled O2 off and removed her own Foley cath. Unable to persuade at present.

## 2013-11-24 NOTE — Progress Notes (Addendum)
Subjective:     Patient reports pain as 2 on 0-10 scale.  Pain low back right hip  Objective: Vital signs in last 24 hours: Temp:  [98.3 F (36.8 C)-98.6 F (37 C)] 98.6 F (37 C) (05/16 1422) Pulse Rate:  [64-77] 64 (05/16 2001) Resp:  [16] 16 (05/16 2001) BP: (140-185)/(79-92) 140/82 mmHg (05/16 2001) SpO2:  [88 %-100 %] 100 % (05/17 0315)  Intake/Output from previous day: 05/16 0701 - 05/17 0700 In: 1847.5 [P.O.:60; I.V.:1227.5; IV Piggyback:560] Out: 2350 [Urine:2350] Intake/Output this shift:     Recent Labs  11/22/13 2045 11/23/13 0522  HGB 11.1* 10.5*    Recent Labs  11/22/13 2045 11/23/13 0522  WBC 16.1* 15.6*  RBC 3.89 3.78*  HCT 33.5* 32.8*  PLT 416* 364    Recent Labs  11/22/13 2045 11/23/13 0522  NA 135* 136*  K 4.1 4.5  CL 96 99  CO2 26 27  BUN 20 16  CREATININE 0.94 0.85  GLUCOSE 102* 97  CALCIUM 9.4 8.8   No results found for this basename: LABPT, INR,  in the last 72 hours  Neurologically intact Intact pulses distally Dorsiflexion/Plantar flexion intact Compartment soft  No change in pain. Hip xray lesser troch avulsion fx. Assessment/Plan:    Lesser troch avulsion fracture.  MRI TL spine pending. BedrestDoroteo Bradford Onda Kattner 11/24/2013, 7:32 AM

## 2013-11-24 NOTE — Progress Notes (Signed)
[facility] PROGRESS NOTE  Nichole Werner BTD:[id number] DOB: 05-Nov-1934 DOA: 11/22/2013 PCP: Gavin Pound, MD Interim summary: Nichole Werner is a 79 y.o. female with recent diagnosis of Metastatic Lung Cancer who was sent for a Pet Scan this Afternoon which returned revealing diffuse metastatic Disease and a RUL Lung Tumor, as well as a Pathologic Fracture of the Right Hip. She had been having increased low back pain for several weeks and was sent for an MRI by Dr. Nelva Bush who interpreted a neoplastic process of the L4 Compression fracture which was found. She report having right hip pain for the past 4 days and she was sent for an PET scan today by Dr. Marin Olp, the results of which revealed a pathologic fracture of the right hip. She was referred tot he ED. She denies any history of trauma or fall. She denies having fevers chills or night sweats, she does report having a loss of appetite over the past few months and an unintentional weight loss of 20 pounds over the past 6 months.     Assessment/Plan: 1. Pathological fracture of the right hip: - secondary to metastatic disease. Orthopedics consulted.  - pain control and conservative management as per discussions.   2. Metastatic lung cancer; - Dr Marin Olp on board and appreciate his recommendations.  - she doesn't want any aggressive measures at this time.  - we have changed her advanced directives to DNR as per her wishes.  - further management as per Dr Marin Olp.   3. UTI: - cultures pending.  - on rocephin.   5. HTN-  Add hydralazine prn, continue Lisinopril, and Monitor BPs.   6. Hypothyroid- Check TSH level and continue Levothyroxine, and adjust PRN.   7. Leukocytosis- Reactive due to #1,#2, #3, and #4. Monitor Trend.   8. Thrombocytosis- Reactive due to #1, #2, #3, and #4. Monitor Trend.   9. Tobacco Use Disorder- Nicotine Patch daily.   10. Compression fracture of the l4: - MRI of the T/l spine pending.     Code  Status: full code Family Communication: none at bedside Disposition Plan: pending .    Consultants:  Ortho  oncology  Procedures:  MRI T/L spine.   Antibiotics:  Rocephin for UTI.   HPI/Subjective:  Reports had a restless night. Other than that Denies any new complaints.   Objective: Filed Vitals:   11/23/13 2001  BP: 140/82  Pulse: 64  Temp:   Resp: 16    Intake/Output Summary (Last 24 hours) at 11/24/13 0842 Last data filed at 11/24/13 0615  Gross per 24 hour  Intake 1847.5 ml  Output   2350 ml  Net -502.5 ml   Filed Weights   11/23/13 0000  Weight: 67.132 kg (148 lb)    Exam:   General:  sleepy  Cardiovascular: s1s2  Respiratory: ctab  Abdomen: soft NT ND BS+  Musculoskeletal: painful ROM of the LE, no pedal edema.   Data Reviewed: Basic Metabolic Panel:  Recent Labs Lab 11/22/13 2045 11/23/13 0522  NA 135* 136*  K 4.1 4.5  CL 96 99  CO2 26 27  GLUCOSE 102* 97  BUN 20 16  CREATININE 0.94 0.85  CALCIUM 9.4 8.8   Liver Function Tests:  Recent Labs Lab 11/22/13 2045  AST 15  ALT 10  ALKPHOS 106  BILITOT 0.4  PROT 7.5  ALBUMIN 3.2*   No results found for this basename: LIPASE, AMYLASE,  in the last 168 hours No results found for this basename:  AMMONIA,  in the last 168 hours CBC:  Recent Labs Lab 11/22/13 2045 11/23/13 0522  WBC 16.1* 15.6*  NEUTROABS 11.5*  --   HGB 11.1* 10.5*  HCT 33.5* 32.8*  MCV 86.1 86.8  PLT 416* 364   Cardiac Enzymes: No results found for this basename: CKTOTAL, CKMB, CKMBINDEX, TROPONINI,  in the last 168 hours BNP (last 3 results) No results found for this basename: PROBNP,  in the last 8760 hours CBG:  Recent Labs Lab 11/22/13 1102  GLUCAP 110*    No results found for this or any previous visit (from the past 240 hour(s)).   Studies: Dg Hip Complete Right  11/23/2013   CLINICAL DATA:  Right lesser trochanter fracture, right hip pain  EXAM: RIGHT HIP - COMPLETE 2+ VIEW   COMPARISON:  No comparisons  FINDINGS: Iliac spurring is noted. Right lesser trochanter fracture is identified with mild distraction. Bowel material and contrast partly obscure the pelvis. No displaced pelvic fracture identified. Lumbar spine disc degenerative change partly visualized.  IMPRESSION: Right lesser trochanter fracture.   Electronically Signed   By: Conchita Paris M.D.   On: 11/23/2013 11:35   Ct Head W Wo Contrast  11/22/2013   CLINICAL DATA:  Right hip fracture, evaluation for intracranial metastasis.  EXAM: CT HEAD WITHOUT AND WITH CONTRAST  TECHNIQUE: Contiguous axial images were obtained from the base of the skull through the vertex without and with intravenous contrast  CONTRAST:  134mL OMNIPAQUE IOHEXOL 300 MG/ML  SOLN  COMPARISON:  NM PET IMAGE INITIAL (PI) SKULL BASE TO THIGH dated 11/22/2013; DG LUMBAR SPINE COMPLETE dated 11/12/2013  FINDINGS: Moderate to severe ventriculomegaly, predominantly on the basis of global parenchymal brain volume loss, however there is mild effacement of the sulci at the convexities. No intraparenchymal hemorrhage, mass effect nor midline shift. Prompt 1 supratentorial white matter hypodensities are non-specific suggest sequelae of chronic small vessel ischemic disease. No acute large vascular territory infarcts. Remote right basal ganglia cystic lacunar infarct in addition to tiny hypodensities in bilateral basal ganglia and thalamus suggesting remote ischemia.  No abnormal extra-axial fluid collections. Basal cisterns are patent. Moderate calcific atherosclerosis of the carotid siphons.  No skull fracture. Subcentimeter lucency in the right occipital calvarium, axial 15/29. The included ocular globes and orbital contents are non-suspicious ; the elongated symmetric ocular globes may reflect myopia for which ophthalmological examination could be considered. The mastoid aircells and included paranasal sinuses are well-aerated.  IMPRESSION: No acute intracranial  process ; no CT findings of intracranial metastasis though, MRI of the brain with contrast would be more sensitive. However, subcentimeter lucency in right occipital calvarium, this nonspecific, and metastasis is not excluded (should MRI of the brain be performed, recommend fat saturation post gad imaging).  Moderate to severe global brain atrophy, with disproportionate sulcal effacement of the convexities which can be assessed with normal pressure hydrocephalus.  Moderate to severe white matter changes suggest chronic small vessel ischemic disease with remote thalamic and basal ganglia lacunar infarcts.   Electronically Signed   By: Elon Alas   On: 11/22/2013 22:23   Ct Chest W Contrast  11/22/2013   CLINICAL DATA:  Right hip fracture, suspected lung malignancy and bony metastases  EXAM: CT CHEST, ABDOMEN, AND PELVIS WITH CONTRAST  TECHNIQUE: Multidetector CT imaging of the chest, abdomen and pelvis was performed following the standard protocol during bolus administration of intravenous contrast.  CONTRAST:  136mL OMNIPAQUE IOHEXOL 300 MG/ML  SOLN  COMPARISON:  PET-CT study of  today's date  FINDINGS: CT CHEST FINDINGS  At lung window settings there are emphysematous changes bilaterally. There is an abnormal 1.3 cm diameter spiculated density anterior to the superior vena cava in the superior medial aspect of the right upper lobe. No pulmonary parenchymal masses are demonstrated elsewhere. There is compressive atelectasis posteriorly, bilaterally.  At mediastinal window settings the cardiac chambers are mildly enlarged. There are coronary artery calcifications present. The caliber of the ascending thoracic aorta and aortic arch is normal. There is mild failure taper of the caliber of the descending thoracic aorta which measures a maximum approximately 3.2 cm. The study was not tailored for pulmonary arterial tree evaluation. The patient has known hypermetabolic mediastinal and right hilar lymph nodes.   There is compression of the body of T3 amounting to approximately 50% and T11 amounting to approximately 15%. The T3 region was noted to be hypermetabolic on the PET scan with findings suspicious for surrounding tumor. No retropulsed bony fragments are demonstrated on the CT images.  CT ABDOMEN AND PELVIS FINDINGS  The liver exhibits mild to moderate ductal dilation. The gallbladder is adequately distended and contains calcified and gas-containing stones. The spleen is normal in size and exhibits punctate calcifications. The pancreas is normal in density and contour. The partially contrast filled loops of small and large bowel exhibit no acute abnormalities. The kidneys exhibit no evidence of obstruction. There is a 2.6 x 1.6 hypodensity in the midpole of the left kidney with Hounsfield measurement of +6 immediately post contrast and 0 on delayed images. This likely reflects a cyst. . The distended urinary bladder is normal in appearance. There is an aneurysm of the suprarenal portion of the abdominal aorta which contains considerable mural thrombus. It measures approximately 3.6 cm in diameter and extends for a length of approximately 5.6 cm.  The uterus is surgically absent. There is no adnexal mass. There is no free intra-abdominal or pelvic fluid. There is no inguinal or umbilical hernia.  There is partial compression of the body of L4 with loss of height of approximately 50%. The bony pelvis exhibits no acute fracture. There is known metastatic disease involving the central portion of the sacrum which is manifested by mildly increased density within the marrow space. There is a fracture involving the lesser trochanter of the right hip with surrounding lytic changes. The left hip appears intact.  IMPRESSION: 1. There is a small spiculated mass medially in the right upper lobe which is known to be hypermetabolic and worrisome for malignancy. 2. There are multiples abnormalities within the skeleton as described  consistent with metastatic disease. There is a fracture involving the lesser trochanter of the right femur with surrounding destructive changes. This demonstrated abnormal uptake on today's PET CT study. Similar abnormality without evidence of fracture is demonstrated in the upper sacral body. 3. There is a suprarenal abdominal aortic aneurysm without evidence of leakage. 4. There are gallstones present. There is intrahepatic ductal dilation. No pancreatic mass is demonstrated.   Electronically Signed   By: David  Martinique   On: 11/22/2013 22:45   Ct Abdomen Pelvis W Contrast  11/22/2013   CLINICAL DATA:  Right hip fracture, suspected lung malignancy and bony metastases  EXAM: CT CHEST, ABDOMEN, AND PELVIS WITH CONTRAST  TECHNIQUE: Multidetector CT imaging of the chest, abdomen and pelvis was performed following the standard protocol during bolus administration of intravenous contrast.  CONTRAST:  169mL OMNIPAQUE IOHEXOL 300 MG/ML  SOLN  COMPARISON:  PET-CT study of today's date  FINDINGS: CT CHEST FINDINGS  At lung window settings there are emphysematous changes bilaterally. There is an abnormal 1.3 cm diameter spiculated density anterior to the superior vena cava in the superior medial aspect of the right upper lobe. No pulmonary parenchymal masses are demonstrated elsewhere. There is compressive atelectasis posteriorly, bilaterally.  At mediastinal window settings the cardiac chambers are mildly enlarged. There are coronary artery calcifications present. The caliber of the ascending thoracic aorta and aortic arch is normal. There is mild failure taper of the caliber of the descending thoracic aorta which measures a maximum approximately 3.2 cm. The study was not tailored for pulmonary arterial tree evaluation. The patient has known hypermetabolic mediastinal and right hilar lymph nodes.  There is compression of the body of T3 amounting to approximately 50% and T11 amounting to approximately 15%. The T3 region  was noted to be hypermetabolic on the PET scan with findings suspicious for surrounding tumor. No retropulsed bony fragments are demonstrated on the CT images.  CT ABDOMEN AND PELVIS FINDINGS  The liver exhibits mild to moderate ductal dilation. The gallbladder is adequately distended and contains calcified and gas-containing stones. The spleen is normal in size and exhibits punctate calcifications. The pancreas is normal in density and contour. The partially contrast filled loops of small and large bowel exhibit no acute abnormalities. The kidneys exhibit no evidence of obstruction. There is a 2.6 x 1.6 hypodensity in the midpole of the left kidney with Hounsfield measurement of +6 immediately post contrast and 0 on delayed images. This likely reflects a cyst. . The distended urinary bladder is normal in appearance. There is an aneurysm of the suprarenal portion of the abdominal aorta which contains considerable mural thrombus. It measures approximately 3.6 cm in diameter and extends for a length of approximately 5.6 cm.  The uterus is surgically absent. There is no adnexal mass. There is no free intra-abdominal or pelvic fluid. There is no inguinal or umbilical hernia.  There is partial compression of the body of L4 with loss of height of approximately 50%. The bony pelvis exhibits no acute fracture. There is known metastatic disease involving the central portion of the sacrum which is manifested by mildly increased density within the marrow space. There is a fracture involving the lesser trochanter of the right hip with surrounding lytic changes. The left hip appears intact.  IMPRESSION: 1. There is a small spiculated mass medially in the right upper lobe which is known to be hypermetabolic and worrisome for malignancy. 2. There are multiples abnormalities within the skeleton as described consistent with metastatic disease. There is a fracture involving the lesser trochanter of the right femur with surrounding  destructive changes. This demonstrated abnormal uptake on today's PET CT study. Similar abnormality without evidence of fracture is demonstrated in the upper sacral body. 3. There is a suprarenal abdominal aortic aneurysm without evidence of leakage. 4. There are gallstones present. There is intrahepatic ductal dilation. No pancreatic mass is demonstrated.   Electronically Signed   By: David  Martinique   On: 11/22/2013 22:45   Nm Pet Image Initial (pi) Skull Base To Thigh  11/22/2013   CLINICAL DATA:  Initial treatment strategy for Lung cancer.  EXAM: NUCLEAR MEDICINE PET SKULL BASE TO THIGH  TECHNIQUE: 8.8 mCi F-18 FDG was injected intravenously. Full-ring PET imaging was performed from the skull base to thigh after the radiotracer. CT data was obtained and used for attenuation correction and anatomic localization.  FASTING BLOOD GLUCOSE:  Value: 110 mg/dl  COMPARISON:  None.  FINDINGS: NECK  No hypermetabolic lymph nodes in the neck.  CHEST  Moderate changes of centrilobular emphysema identified. The pulmonary nodule within the right upper lobe measures 1.3 cm and has an SUV max equal to 6.3. Calcified granuloma is identified within the left lower lobe.  Normal heart size. No pericardial effusion. There is calcified atherosclerotic disease involving the thoracic aorta. Calcifications also involve the LAD, left circumflex and RCA coronary arteries. Hypermetabolic right hilar lymph node has an SUV max equal to 3.9. Calcified left sided mediastinal and left hilar lymph nodes are identified.  ABDOMEN/PELVIS  Multiple stones are identified within the gallbladder. The pancreas is within normal limits. Calcified granulomas identified within the spleen.  The adrenal glands are both normal. Left renal cyst is identified. This is incompletely characterized without IV contrast. No hypermetabolic upper abdominal or pelvic lymph nodes identified.  Calcified atherosclerotic disease involves the abdominal aorta. The aorta has a  maximum AP dimension of 3.5 cm.  SKELETON  Multifocal hypermetabolic bone metastases are identified. Metastasis within the central portion of the sacrum measure 4.5 cm and has an SUV max equal to 14.8. There is a metastasis with pathologic fracture involving the lesser trochanter of the proximal right femur. This has an SUV max equal to 13.8. Tumor involving the right side of the C1 vertebra has an SUV max equal to 13.7. There is tumor involving the L4 vertebra with associated superior endplate fracture deformity. At T3 and T4 tumor appears to extend posteriorly into the epidural space.  IMPRESSION: 1. Examination is positive for hypermetabolic tumor within the right upper lobe with associated ipsilateral hilar lymph node metastasis. 2. Multifocal bone metastasis involving the axial and proximal appendicular skeleton. There are associated pathologic fractures involving the superior endplate of L4 and the lesser trochanter of the proximal right femur. Suspicious activity posterior to the T3 and T4 vertebra may reflect epidural extension of tumor. This could be better assessed with thoracic spine MRI. 3. Atherosclerotic disease including multi vessel coronary artery calcifications. Aneurysmal dilatation of the infrarenal abdominal aorta measures 3.5 cm.   Electronically Signed   By: Kerby Moors M.D.   On: 11/22/2013 14:45    Scheduled Meds: . beta carotene w/minerals  1 tablet Oral Daily  . cefTRIAXone (ROCEPHIN)  IV  1 g Intravenous QHS  . enoxaparin (LOVENOX) injection  40 mg Subcutaneous QHS  . levothyroxine  50 mcg Oral QAC breakfast  . lisinopril  10 mg Oral Daily  . metoprolol succinate  100 mg Oral Daily  . nicotine  14 mg Transdermal Daily   Continuous Infusions: . sodium chloride Stopped (11/24/13 0222)    Principal Problem:   Pathologic fracture Active Problems:   Mixed hyperlipidemia   Tobacco use disorder   Essential hypertension, benign   Metastatic lung cancer (metastasis from  lung to other site)   Compression fracture of L4 lumbar vertebra   UTI (lower urinary tract infection)   Hypothyroidism   Leukocytosis   Thrombocytosis    Time spent: 25 minutes.     Hosie Poisson  [facility] Pager [phone]. If 7PM-7AM, please contact night-coverage at [email or web address], password [facility] 11/24/2013, 8:42 AM  LOS: 2 days

## 2013-11-24 NOTE — Progress Notes (Signed)
INITIAL NUTRITION ASSESSMENT  DOCUMENTATION CODES Per approved criteria  -Not Applicable   INTERVENTION: - Encourage adequate PO intake of meals. Patient refusing nutrition supplements at this time.  - RD will continue to monitor.   NUTRITION DIAGNOSIS: Inadequate oral intake related to decreased appetite as evidenced by 0% meal intake.   Goal: Patient will meet >/=90% of estimated nutrition needs  Monitor:  PO intake, weight, labs  Reason for Assessment: Malnutrition screening tool  79 y.o. female  Admitting Dx: Pathologic fracture  ASSESSMENT: 79 year old female patient with recent diagnosis of metastatic lung cancer and pathologic fracture of the right hip.   Patient reports loss of appetite over the last few months and unintentional weight loss of 20 pounds in 6 months. Patient is refusing to eat meals due to lack of appetite (0-15%). She declines nutrition supplements at this time. Unable to complete nutrition focused physical exam due to patient in pain. However, I suspect some degree of malnutrition given poor appetite, weight loss, and appearance of some moderate subcutaneous fat and muscle lost in the upper arms.   Patient does not want aggressive therapy for cancer. However, possible radiation therapy.   Height: Ht Readings from Last 1 Encounters:  11/23/13 5\' 4"  (1.626 m)    Weight: Wt Readings from Last 1 Encounters:  11/23/13 148 lb (67.132 kg)    Ideal Body Weight: 120 pounds  % Ideal Body Weight: 123%  Wt Readings from Last 10 Encounters:  11/23/13 148 lb (67.132 kg)  11/12/13 138 lb (62.596 kg)  06/25/13 146 lb (66.225 kg)  ? If current weight is reflective of actual weight.   Usual Body Weight: 158 pounds  % Usual Body Weight: 94%  BMI:  Body mass index is 25.39 kg/(m^2). Patient is overweight.   Estimated Nutritional Needs: Kcal: 1500-1600 kcal Protein: 80-95 g Fluid: >2.0 L/day  Skin: Intact  Diet Order: Cardiac  EDUCATION  NEEDS: -No education needs identified at this time   Intake/Output Summary (Last 24 hours) at 11/24/13 1417 Last data filed at 11/24/13 1407  Gross per 24 hour  Intake 1312.5 ml  Output   2350 ml  Net -1037.5 ml    Last BM: 5/17   Labs:   Recent Labs Lab 11/22/13 2045 11/23/13 0522  NA 135* 136*  K 4.1 4.5  CL 96 99  CO2 26 27  BUN 20 16  CREATININE 0.94 0.85  CALCIUM 9.4 8.8  GLUCOSE 102* 97    CBG (last 3)   Recent Labs  11/22/13 1102  GLUCAP 110*    Scheduled Meds: . beta carotene w/minerals  1 tablet Oral Daily  . cefTRIAXone (ROCEPHIN)  IV  1 g Intravenous QHS  . enoxaparin (LOVENOX) injection  40 mg Subcutaneous QHS  . levothyroxine  50 mcg Oral QAC breakfast  . lisinopril  10 mg Oral Daily  . metoprolol succinate  100 mg Oral Daily  . nicotine  14 mg Transdermal Daily    Continuous Infusions: . sodium chloride 75 mL/hr at 11/24/13 1035    Past Medical History  Diagnosis Date  . Hypertension   . OA (osteoarthritis)   . Hypothyroidism   . Undiagnosed cardiac murmurs   . Hx-TIA (transient ischemic attack)   . Clavicle fracture   . Skin cancer   . Bone cancer 11/20/13  . Lung cancer 11/22/13    Past Surgical History  Procedure Laterality Date  . Laminectomy  1973    with disc removal  . Abdominal  hysterectomy    . Leg surgery      "rebuilt left leg"    Larey Seat, RD, LDN Pager #: [place] Pager #: [phone]

## 2013-11-24 NOTE — Care Management Note (Signed)
Cm consult for [facility] needs. PT/OT consult placed to assist with dc planning. PTA pt from home with family. Will continue to follow.    Venita Lick Christian Borgerding,MSN,RN [phone]

## 2013-11-25 ENCOUNTER — Ambulatory Visit (HOSPITAL_BASED_OUTPATIENT_CLINIC_OR_DEPARTMENT_OTHER): Payer: Medicare Other

## 2013-11-25 ENCOUNTER — Inpatient Hospital Stay (HOSPITAL_COMMUNITY): Payer: Medicare Other

## 2013-11-25 ENCOUNTER — Ambulatory Visit: Payer: Medicare Other | Admitting: Hematology & Oncology

## 2013-11-25 ENCOUNTER — Other Ambulatory Visit: Payer: Medicare Other | Admitting: Lab

## 2013-11-25 DIAGNOSIS — M25559 Pain in unspecified hip: Secondary | ICD-10-CM

## 2013-11-25 DIAGNOSIS — E43 Unspecified severe protein-calorie malnutrition: Secondary | ICD-10-CM | POA: Diagnosis present

## 2013-11-25 LAB — COMPREHENSIVE METABOLIC PANEL
ALBUMIN: 2.6 g/dL — AB (ref 3.5–5.2)
ALT: 10 U/L (ref 0–35)
AST: 15 U/L (ref 0–37)
Alkaline Phosphatase: 112 U/L (ref 39–117)
BUN: 10 mg/dL (ref 6–23)
CALCIUM: 8.5 mg/dL (ref 8.4–10.5)
CO2: 24 meq/L (ref 19–32)
Chloride: 100 mEq/L (ref 96–112)
Creatinine, Ser: 0.74 mg/dL (ref 0.50–1.10)
GFR calc Af Amer: >90 mL/min (ref >90)
GFR, EST NON AFRICAN AMERICAN: 80 mL/min — AB (ref >90)
Glucose, Bld: 107 mg/dL — ABNORMAL HIGH (ref 70–99)
Potassium: 3.5 mEq/L — ABNORMAL LOW (ref 3.7–5.3)
Sodium: 139 mEq/L (ref 137–147)
Total Bilirubin: 0.3 mg/dL (ref 0.3–1.2)
Total Protein: 6.7 g/dL (ref 6.0–8.3)

## 2013-11-25 LAB — URINE CULTURE
Colony Count: NO GROWTH
Culture: NO GROWTH
Special Requests: NORMAL

## 2013-11-25 LAB — CBC
HEMATOCRIT: 33.4 % — AB (ref 36.0–46.0)
Hemoglobin: 11.1 g/dL — ABNORMAL LOW (ref 12.0–15.0)
MCH: 28.4 pg (ref 26.0–34.0)
MCHC: 33.2 g/dL (ref 30.0–36.0)
MCV: 85.4 fL (ref 78.0–100.0)
PLATELETS: 410 10*3/uL — AB (ref 150–400)
RBC: 3.91 MIL/uL (ref 3.87–5.11)
RDW: 13.8 % (ref 11.5–15.5)
WBC: 14.6 10*3/uL — ABNORMAL HIGH (ref 4.0–10.5)

## 2013-11-25 LAB — TSH: TSH: 2.53 u[IU]/mL (ref 0.350–4.500)

## 2013-11-25 MED ORDER — POTASSIUM CHLORIDE CRYS ER 20 MEQ PO TBCR
40.0000 meq | EXTENDED_RELEASE_TABLET | Freq: Once | ORAL | Status: AC
  Administered 2013-11-25: 40 meq via ORAL
  Filled 2013-11-25: qty 2

## 2013-11-25 MED ORDER — VITAMINS A & D EX OINT
TOPICAL_OINTMENT | CUTANEOUS | Status: AC
  Administered 2013-11-25: 5
  Filled 2013-11-25: qty 5

## 2013-11-25 NOTE — Progress Notes (Signed)
CSW met with pt / daughter at bed side to assist with d/c planning needs. PT / OT evals and MRI are still pending. Home with daughter vs ST SNF placement reviewed. Daughter would like to hear all recommendations prior to making a decision. CSW will initiate SNF search in order to provide a list of openings incase SNF is needed. Daughter has CSW cell # and has been encouraged to contact CSW with any questions. CSW will continue to follow to assist with d/c planning.  Werner Lean LCSW [phone]

## 2013-11-25 NOTE — Progress Notes (Signed)
Clinical Social Work Department BRIEF PSYCHOSOCIAL ASSESSMENT 11/25/2013  Patient:  Nichole Werner, Nichole Werner     Account Number:  [id number]     [place]vo date:  11/22/2013  Clinical Social Worker:  Lacie Scotts  Date/Time:  11/25/2013 07:53 AM  Referred by:  CSW  Date Referred:  11/25/2013 Referred for  SNF Placement   Other Referral:   Interview type:  Patient Other interview type:    PSYCHOSOCIAL DATA Living Status:  OTHER Admitted from facility:   Level of care:   Primary support name:  Gemma Payor Primary support relationship to patient:  CHILD, ADULT Degree of support available:   supportive    CURRENT CONCERNS Current Concerns  Post-Acute Placement   Other Concerns:    SOCIAL WORK ASSESSMENT / PLAN Pt is a 79 yr old female admitted from [facility] ( senior independent living ). Pt had a Pet Scan done on 5/15 which showed a pathologic fx of her right hip. Pt also has lung ca. She has declined aggressive treatment. Ortho has consulted and MRI is pending. Surgery has not been recommended at this time. CSW plans to meet with pt / daughter today to assist with d/c planning. 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 She is  oriented to self, sometimes to place/situation.   Assessment/plan status:  Psychosocial Support/Ongoing Assessment of Needs Other assessment/ plan:   Information/referral to community resources:   None at [employer] time.    PATIENT'S/FAMILY'S RESPONSE TO PLAN OF CARE: MRI, PT / OT are pending. Pt has some confusion. CSW will meet with family today to assist with d/c planning.   Werner Lean LCSW [phone]

## 2013-11-25 NOTE — Clinical Documentation Improvement (Signed)
Possible Clinical Conditions?   Severe Malnutrition   Severe Protein Calorie Malnutrition Emaciation  Cachexia   Other Condition Cannot clinically determine   Risk Factors: (per RD's evaluation of 11/24/13): Patient with 20 pound unintentional weight loss in 6 months, 0% meal intake, refusing to eat 2/2 lack of appetite, appearance of some moderate SQ fat and muscle loss in upper arms. Refusing nutritional supplements at this time.    Thank You, Theron Arista, Clinical Documentation Specialist:  [phone]  [place] Information Management

## 2013-11-25 NOTE — Progress Notes (Signed)
OT Cancellation Note  Patient Details Name: Nichole Werner MRN: [id number] DOB: 23-Jul-1934   Cancelled Treatment:    Reason Eval/Treat Not Completed: Medical issues which prohibited therapy Patient not medically ready Will await results of MRI results regarding tumor proximal femur--and ortho plan/recommendations regarding this;      Betsy Pries 11/25/2013, 10:02 AM

## 2013-11-25 NOTE — Progress Notes (Signed)
Subjective: Patient still with right hip pain   Objective: Vital signs in last 24 hours: Temp:  [98.3 F (36.8 C)-98.6 F (37 C)] 98.5 F (36.9 C) (05/18 0600) Pulse Rate:  [69-113] 69 (05/18 0600) Resp:  [16-17] 17 (05/18 0600) BP: (154-193)/(82-96) 186/82 mmHg (05/18 0600) SpO2:  [92 %-97 %] 97 % (05/18 0600)  Intake/Output from previous day: 05/17 0701 - 05/18 0700 In: 2231.3 [P.O.:800; I.V.:1381.3; IV Piggyback:50] Out: 850 [Urine:850] Intake/Output this shift: Total I/O In: 1570 [P.O.:320; I.V.:1200; IV Piggyback:50] Out: 750 [Urine:750]   Recent Labs  11/22/13 2045 11/23/13 0522 11/25/13 0435  HGB 11.1* 10.5* 11.1*    Recent Labs  11/23/13 0522 11/25/13 0435  WBC 15.6* 14.6*  RBC 3.78* 3.91  HCT 32.8* 33.4*  PLT 364 410*    Recent Labs  11/23/13 0522 11/25/13 0435  NA 136* 139  K 4.5 3.5*  CL 99 100  CO2 27 24  BUN 16 10  CREATININE 0.85 0.74  GLUCOSE 97 107*  CALCIUM 8.8 8.5   No results found for this basename: LABPT, INR,  in the last 72 hours  Minimal pain on ROM right hip  Assessment/Plan: Right lesser trochanter avulsion fracture- This injury in and of itself can be treated non-operatively and will allow for WBAT once comfortable, however, we need to know the status of the tumor in her proximal femur. The plain radiographs do not define it well at all. Recommend MRI right hip to evaluate lesion and see if it needs prophylactic fixation. If not, then begin PT and WBAT RLE   Gearlean Alf 11/25/2013, 6:59 AM

## 2013-11-25 NOTE — Progress Notes (Signed)
[facility] PROGRESS NOTE  Nichole Werner YDX:[id number] DOB: 01-07-1935 DOA: 11/22/2013 PCP: Gavin Pound, MD Interim summary: Nichole Werner is a 79 y.o. female with recent diagnosis of Metastatic Lung Cancer who was sent for a Pet Scan this Afternoon which returned revealing diffuse metastatic Disease and a RUL Lung Tumor, as well as a Pathologic Fracture of the Right Hip. She had been having increased low back pain for several weeks and was sent for an MRI by Dr. Nelva Bush who interpreted a neoplastic process of the L4 Compression fracture which was found. She report having right hip pain for the past 4 days and she was sent for an PET scan today by Dr. Marin Olp, the results of which revealed a pathologic fracture of the right hip. She was referred tot he ED. She denies any history of trauma or fall. She denies having fevers chills or night sweats, she does report having a loss of appetite over the past few months and an unintentional weight loss of 20 pounds over the past 6 months.     Assessment/Plan: 1. Pathological fracture of the right hip: - secondary to metastatic disease. Orthopedics consulted.  - pain control and conservative management as per discussions.  - MRI of the right hip ordered and pending.   2. Metastatic lung cancer; - Dr Marin Olp on board and appreciate his recommendations.  - she doesn't want any aggressive measures at this time.  - we have changed her advanced directives to DNR as per her wishes.  - further management as per Dr Marin Olp.   3. UTI: Urine cultures negative.will stop rocephin.   5. HTN-  Add hydralazine prn, continue Lisinopril, and Monitor BPs.   6. Hypothyroid- Check TSH level and continue Levothyroxine, and adjust PRN.   7. Leukocytosis- Reactive due to #1,#2, #3, and #4. Monitor Trend.   8. Thrombocytosis- Reactive due to #1, #2, #3, and #4. Monitor Trend.   9. Tobacco Use Disorder- Nicotine Patch daily.   10. Compression fracture of the  l4: - MRI of the T/l spine pending.     Code Status: full code Family Communication: family at bedside Disposition Plan: pending .    Consultants:  Ortho  oncology  Procedures:  MRI T/L spine.   Antibiotics:  Rocephin for UTI. Stopped 5/18/   HPI/Subjective:  Reports had a restless night. Other than that Denies any new complaints.   Objective: Filed Vitals:   11/25/13 0937  BP: 193/84  Pulse: 77  Temp: 98.6 F (37 C)  Resp: 18    Intake/Output Summary (Last 24 hours) at 11/25/13 1131 Last data filed at 11/25/13 0834  Gross per 24 hour  Intake 2051.25 ml  Output    850 ml  Net 1201.25 ml   Filed Weights   11/23/13 0000  Weight: 67.132 kg (148 lb)    Exam:   General: alert and humorous.  Cardiovascular: s1s2  Respiratory: ctab  Abdomen: soft NT ND BS+  Musculoskeletal: painful ROM of the LE, no pedal edema.   Data Reviewed: Basic Metabolic Panel:  Recent Labs Lab 11/22/13 2045 11/23/13 0522 11/25/13 0435  NA 135* 136* 139  K 4.1 4.5 3.5*  CL 96 99 100  CO2 26 27 24   GLUCOSE 102* 97 107*  BUN 20 16 10   CREATININE 0.94 0.85 0.74  CALCIUM 9.4 8.8 8.5   Liver Function Tests:  Recent Labs Lab 11/22/13 2045 11/25/13 0435  AST 15 15  ALT 10 10  ALKPHOS 106 112  BILITOT 0.4 0.3  PROT 7.5 6.7  ALBUMIN 3.2* 2.6*   No results found for this basename: LIPASE, AMYLASE,  in the last 168 hours No results found for this basename: AMMONIA,  in the last 168 hours CBC:  Recent Labs Lab 11/22/13 2045 11/23/13 0522 11/25/13 0435  WBC 16.1* 15.6* 14.6*  NEUTROABS 11.5*  --   --   HGB 11.1* 10.5* 11.1*  HCT 33.5* 32.8* 33.4*  MCV 86.1 86.8 85.4  PLT 416* 364 410*   Cardiac Enzymes: No results found for this basename: CKTOTAL, CKMB, CKMBINDEX, TROPONINI,  in the last 168 hours BNP (last 3 results) No results found for this basename: PROBNP,  in the last 8760 hours CBG:  Recent Labs Lab 11/22/13 1102  GLUCAP 110*    Recent  Results (from the past 240 hour(s))  URINE CULTURE     Status: None   Collection Time    11/23/13  8:11 PM      Result Value Ref Range Status   Specimen Description URINE, CATHETERIZED   Final   Special Requests Normal   Final   Culture  Setup Time     Final   Value: 11/24/2013 03:35     Performed at [facility]     Final   Value: NO GROWTH     Performed at [facility]   Culture     Final   Value: NO GROWTH     Performed at [facility]   Report Status 11/25/2013 FINAL   Final     Studies: No results found.  Scheduled Meds: . beta carotene w/minerals  1 tablet Oral Daily  . cefTRIAXone (ROCEPHIN)  IV  1 g Intravenous QHS  . enoxaparin (LOVENOX) injection  40 mg Subcutaneous QHS  . levothyroxine  50 mcg Oral QAC breakfast  . lisinopril  10 mg Oral Daily  . metoprolol succinate  100 mg Oral Daily  . nicotine  14 mg Transdermal Daily  . potassium chloride  40 mEq Oral Once   Continuous Infusions: . sodium chloride 75 mL/hr at 11/24/13 1035    Principal Problem:   Pathologic fracture Active Problems:   Mixed hyperlipidemia   Tobacco use disorder   Essential hypertension, benign   Metastatic lung cancer (metastasis from lung to other site)   Compression fracture of L4 lumbar vertebra   UTI (lower urinary tract infection)   Hypothyroidism   Leukocytosis   Thrombocytosis   Severe malnutrition    Time spent: 25 minutes.     Hosie Poisson  [facility] Pager [phone]. If 7PM-7AM, please contact night-coverage at [email or web address], password [facility] 11/25/2013, 11:31 AM  LOS: 3 days

## 2013-11-25 NOTE — Progress Notes (Signed)
Mrs.Heigl is a little bit confused this morning. I don't know if this might be from pain medication. She says that she was admitted last night.  We still don't know about the right hip. She did have MRI of the thoracic or lumbar spine. These were done. The results are still pending.  It is hard to know about her hip pain.  She did get some diarrhea over the weekend.  She's had no cough. I am not sure as to how much she's eating.  Her pre-albumin is 13.9.  At this point, I think we are going to have to see what is the status of the right hip. If she needs surgery for this,  then we can get the pathology from that specimen.  If she has a significant compression fracture at L4, then this might be a reasonable spot for biopsy and kyphoplasty.  I still suspect that she'll need radiation treatments.  Am not sure how much he really understands as to what is going on the right now.  I will talk to her son-in-law. Pete E.  Psalm 34:2

## 2013-11-25 NOTE — Progress Notes (Signed)
PT Cancellation Note  Patient Details Name: LARCENIA HOLADAY MRN: [id number] DOB: Nov 10, 1934   Cancelled Treatment:    Reason Eval/Treat Not Completed: Patient not medically ready Will await results of MRI results regarding tumor proximal femur--and ortho plan/recommendations regarding this;    Neil Crouch 11/25/2013, 9:51 AM

## 2013-11-26 LAB — BASIC METABOLIC PANEL
BUN: 11 mg/dL (ref 6–23)
CHLORIDE: 102 meq/L (ref 96–112)
CO2: 22 meq/L (ref 19–32)
Calcium: 8.3 mg/dL — ABNORMAL LOW (ref 8.4–10.5)
Creatinine, Ser: 0.72 mg/dL (ref 0.50–1.10)
GFR calc non Af Amer: 81 mL/min — ABNORMAL LOW (ref >90)
GLUCOSE: 104 mg/dL — AB (ref 70–99)
Potassium: 4 mEq/L (ref 3.7–5.3)
Sodium: 138 mEq/L (ref 137–147)

## 2013-11-26 MED ORDER — HALOPERIDOL LACTATE 5 MG/ML IJ SOLN
0.5000 mg | Freq: Once | INTRAMUSCULAR | Status: AC
  Administered 2013-11-26: 0.5 mg via INTRAVENOUS
  Filled 2013-11-26: qty 1

## 2013-11-26 MED ORDER — HALOPERIDOL LACTATE 5 MG/ML IJ SOLN
2.0000 mg | Freq: Four times a day (QID) | INTRAMUSCULAR | Status: DC | PRN
  Administered 2013-12-01: 2 mg via INTRAVENOUS
  Filled 2013-11-26: qty 1

## 2013-11-26 NOTE — Progress Notes (Signed)
OT Cancellation Note  Patient Details Name: Nichole Werner MRN: [id number] DOB: 02-12-1935   Cancelled Treatment:    Reason Eval/Treat Not Completed: Other (comment) Pt sleeping and per PT, nursing requesting to let pt sleepy. Will check back on pt next date.  Alycia Patten Kacelyn Rowzee [phone] 11/26/2013, 12:18 PM

## 2013-11-26 NOTE — Progress Notes (Signed)
PT Cancellation Note  Patient Details Name: Nichole Werner MRN: [id number] DOB: 1934-09-03   Cancelled Treatment:    Reason Eval/Treat Not Completed: Other (comment) Per RN pt sleeping and requests to let pt rest at this time.  Will check back as schedule permits.   Junius Argyle 11/26/2013, 1:28 PM Carmelia Bake, PT, DPT 11/26/2013 Pager: [phone]

## 2013-11-26 NOTE — Progress Notes (Signed)
Patient ID: Nichole Werner, female   DOB: [birth year], 79 y.o.   MRN: [id number]  Pt presented to hospital 4 days ago with LBP with a PET scan positive for pathologic hip fx.  She has been seen by Dr. Tonita Cong and Dr. Wynelle Link for her back and hip, respectively. Hospitalists and oncology following as well.  Dr. Tonita Cong reviewed her MRI's of T/L spine. There is metastatic disease noted at T3, T4, T11 with pathologic fx's at T3 and T11. Also pathologic fx noted at L4. There is a metastatic sacral lesion affecting the S1 nerve root.   Her hip MRI notes lesion of the L femoral head and R lesser troch with pathologic fx/avulsion of the iliopsoas tendon insertion.   Impression: multiple pathologic lesions and fx's thoracic, lumbar spine, sacrum, hip.  Dr. Tonita Cong discussed with hospitalist today who will also discuss with Dr. Marin Olp Recommend continued bedrest in regards to her back. Plan radiation. Could consider kyphoplasty for her compression fx's
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
## 2013-11-26 NOTE — Progress Notes (Signed)
IR team aware of request to eval for possible vertebroplasty. Chart reviewed/ Will have Dr. Barbie Banner review recent MRI to evaluate candidacy of percutaneous vertebroplasty. Will order NPO p MN in case we are able to proceed with case tomorrow. IR team following.  Ascencion Dike PA-C Interventional Radiology 11/26/2013 3:52 PM

## 2013-11-26 NOTE — Progress Notes (Signed)
[facility] PROGRESS NOTE  Nichole Werner YQM:[id number] DOB: [birth year] DOA: 11/22/2013 PCP: Gavin Pound, MD Interim summary: Nichole Werner is a 79 y.o. female with recent diagnosis of Metastatic Lung Cancer who was sent for a Pet Scan this Afternoon which returned revealing diffuse metastatic Disease and a RUL Lung Tumor, as well as a Pathologic Fracture of the Right Hip. She had been having increased low back pain for several weeks and was sent for an MRI by Dr. Nelva Bush who interpreted a neoplastic process of the L4 Compression fracture which was found. She report having right hip pain for the past 4 days and she was sent for an PET scan today by Dr. Marin Olp, the results of which revealed a pathologic fracture of the right hip. She was referred tot he ED. She denies any history of trauma or fall. She denies having fevers chills or night sweats, she does report having a loss of appetite over the past few months and an unintentional weight loss of 20 pounds over the past 6 months. She was admitted to medical service for further management. MRI of the thoracic , lumbar and right hip ordered. Results of the right hip discussed the patient's daughter at bedside. MRI of the lumbar spine showed L4 pathological compression fracture and moderate stenosis,. MRI of the thoracic spine showed  Pathological fracture of the T3 and T 11. IR consult put in to see if she could get kyphoplasty. MRI of the right hip showed several areas of metastatic disease in the bones and surgery is not indicated. Recommendations are for pain control and bedrest for now. Called Dr Marin Olp office on 5/19, he is out of office today. Plan to call him on 5/20 and see if we need to consult radiation oncology for initiating radiation treatments. Currently patient is confused with bouts of agitation, pulling out IV lines and foley catheter. She was started on haldol with some relief.     Assessment/Plan: 1. Pathological fracture of the  right hip: - secondary to metastatic disease. Orthopedics consulted.  - pain control and conservative management as per discussions.  - MRI of the right hip done and showed metastatic disease. Will benefit from radiation. Awaiting Oncology recommendations.  Discussed with orthopedics on 5/19  2. Metastatic lung cancer; - Dr Marin Olp on board and appreciate his recommendations.  - she doesn't want any aggressive measures at this time.  - we have changed her advanced directives to DNR as per her wishes.  - further management as per Dr Marin Olp.   3. UTI: Urine cultures negative.will stop rocephin.   5. HTN-  Add hydralazine prn, continue Lisinopril, and Monitor BPs.   6. Hypothyroid-TSH within normal limits. and continue Levothyroxine,.   7. Leukocytosis- Reactive due to #1,#2, #3, and #4. Monitor Trend.   8. Thrombocytosis- Reactive due to #1, #2, #3, and #4. Monitor Trend.   9. Tobacco Use Disorder- Nicotine Patch daily.   10. Compression fracture of the l4: - MRI of the T/l spine done showed multip level compression fractures. IR consult put in for possible kyphoplasty.    DVT prophylaxis.     Code Status: full code Family Communication:none at bedside.  Disposition Plan: pending further management.    Consultants:  Ortho  oncology  Procedures:  MRI T/L spine.   Antibiotics:  Rocephin for UTI. Stopped 5/18/   HPI/Subjective:  Reports had a restless night. Other than that Denies any new complaints.   Objective: Filed Vitals:   11/26/13 5284  BP: 114/62  Pulse: 73  Temp: 98.6 F (37 C)  Resp: 16    Intake/Output Summary (Last 24 hours) at 11/26/13 1515 Last data filed at 11/26/13 0948  Gross per 24 hour  Intake 1651.25 ml  Output   1100 ml  Net 551.25 ml   Filed Weights   11/23/13 0000  Weight: 67.132 kg (148 lb)    Exam:   General: alert and resting.   Cardiovascular: s1s2  Respiratory: ctab  Abdomen: soft NT ND  BS+  Musculoskeletal: painful ROM of the LE, no pedal edema.   Data Reviewed: Basic Metabolic Panel:  Recent Labs Lab 11/22/13 2045 11/23/13 0522 11/25/13 0435 11/26/13 0421  NA 135* 136* 139 138  K 4.1 4.5 3.5* 4.0  CL 96 99 100 102  CO2 26 27 24 22   GLUCOSE 102* 97 107* 104*  BUN 20 16 10 11   CREATININE 0.94 0.85 0.74 0.72  CALCIUM 9.4 8.8 8.5 8.3*   Liver Function Tests:  Recent Labs Lab 11/22/13 2045 11/25/13 0435  AST 15 15  ALT 10 10  ALKPHOS 106 112  BILITOT 0.4 0.3  PROT 7.5 6.7  ALBUMIN 3.2* 2.6*   No results found for this basename: LIPASE, AMYLASE,  in the last 168 hours No results found for this basename: AMMONIA,  in the last 168 hours CBC:  Recent Labs Lab 11/22/13 2045 11/23/13 0522 11/25/13 0435  WBC 16.1* 15.6* 14.6*  NEUTROABS 11.5*  --   --   HGB 11.1* 10.5* 11.1*  HCT 33.5* 32.8* 33.4*  MCV 86.1 86.8 85.4  PLT 416* 364 410*   Cardiac Enzymes: No results found for this basename: CKTOTAL, CKMB, CKMBINDEX, TROPONINI,  in the last 168 hours BNP (last 3 results) No results found for this basename: PROBNP,  in the last 8760 hours CBG:  Recent Labs Lab 11/22/13 1102  GLUCAP 110*    Recent Results (from the past 240 hour(s))  URINE CULTURE     Status: None   Collection Time    11/23/13  8:11 PM      Result Value Ref Range Status   Specimen Description URINE, CATHETERIZED   Final   Special Requests Normal   Final   Culture  Setup Time     Final   Value: 11/24/2013 03:35     Performed at [facility]     Final   Value: NO GROWTH     Performed at [facility]   Culture     Final   Value: NO GROWTH     Performed at [facility]   Report Status 11/25/2013 FINAL   Final     Studies: Mr Thoracic Spine Wo Contrast  11/25/2013   CLINICAL DATA:  79 year old female with metastatic lung cancer. Pathologic fractures. Staging.  EXAM: MRI THORACIC AND LUMBAR SPINE WITHOUT CONTRAST  TECHNIQUE:  Multiplanar and multiecho pulse sequences of the thoracic and lumbar spine were obtained without intravenous contrast.  COMPARISON:  Hip MRI 11/25/2013, reported separately. Chest abdomen pelvis CT 11/22/2013.  FINDINGS: The examination had to be discontinued prior to completion due to pain.  MR THORACIC SPINE FINDINGS  Limited sagittal imaging of the cervical spine is grossly negative.  No sagittal T2 weighted imaging of the thoracic spine was obtained. Axial thoracic spine imaging is degraded by motion despite repeated imaging attempts.  There is a pathologic fracture of the T3 vertebral body with severe loss of height. There is mild retropulsion  of bone. There is subsequent mild spinal stenosis but no definite cord compression.  Adjacent to this level there is abnormal signal in the right T4 pedicle and posterior vertebral body compatible with metastatic disease. The right T4 neural foramen may be affected, but there is not definitely additional epidural tumor at this level. No cord compression. There is superimposed degenerative facet hypertrophy at this level.  No metastatic disease identified in T1, T2, or from the T5 to the T10 thoracic vertebrae.  There is a right paracentral disc herniation at T9-T10 resulting in effaced ventral CSF space but no significant spinal stenosis (series 7, image 29).  There is a pathologic moderate compression fracture of the T11 vertebral body. There is mild associated posterior disc osteophyte protrusion at T11-T12 narrowing the ventral CSF space, but no significant spinal stenosis or cord compression (note superimposed mild facet hypertrophy).  No metastasis identified in the T12 vertebra.  No spinal cord signal abnormality identified. Conus medullaris better depicted on the lumbar images described below.  Abnormal dependent increased signal in the right lung has increased. There is now a small layering right pleural effusion. Stable visualized upper abdominal viscera.  MR  LUMBAR SPINE FINDINGS  Sagittal and axial images degraded by motion despite repeated imaging attempts.  Normal lumbar segmentation. No lumbar metastatic disease identified from the L1 level to the L3 level.  Moderate pathologic compression fracture of L4 with tumor involving the left pedicle and lamina. There is evidence of early epidural tumor at this level, chiefly at the left L4 neural foramen (series 12, image 10). Multifactorial moderate spinal and left lateral recess stenosis occur. No L3 foraminal involvement.  Previous left laminectomy at L4-L5. No definite L5 metastatic disease.  Normal non contrast appearance of the conus medullaris. Detail of the cauda equina Limited due to motion.  Central tumor in the sacrum eccentric to the right affecting the S1 and S2 levels. There is epidural tumor along the course of the exiting right S1 nerve (Series 12, image 3 and series 15, image 32). No sacral spinal stenosis.  Stable visualized abdominal viscera, including aortic aneurysm.  IMPRESSION: MR THORACIC SPINE IMPRESSION  1. Metastatic disease involving the T3, T4, and T11 vertebral bodies with pathologic compression fractures at T3 and T11. Mild multifactorial spinal stenosis at these levels but no thoracic cord compression. Suspect early epidural tumor at the right T4 neural foramen. 2. Small layering right pleural effusion. Increased abnormal signal in the dependent right lung.  MR LUMBAR SPINE IMPRESSION  1. Lumbar metastatic disease limited to the L4 vertebral body with pathologic compression fracture. Subsequent multifactorial moderate L3-L4 spinal and left lateral recess stenosis. Suspect early epidural tumor at the left L4 neuroforamen. 2. Midline to right of midline tumor in the sacrum at S1 and S2. Epidural tumor affecting the course of the exiting right S1 nerve.   Electronically Signed   By: Lars Pinks M.D.   On: 11/25/2013 14:43   Mr Lumbar Spine Wo Contrast  11/25/2013   CLINICAL DATA:  79 year old  female with metastatic lung cancer. Pathologic fractures. Staging.  EXAM: MRI THORACIC AND LUMBAR SPINE WITHOUT CONTRAST  TECHNIQUE: Multiplanar and multiecho pulse sequences of the thoracic and lumbar spine were obtained without intravenous contrast.  COMPARISON:  Hip MRI 11/25/2013, reported separately. Chest abdomen pelvis CT 11/22/2013.  FINDINGS: The examination had to be discontinued prior to completion due to pain.  MR THORACIC SPINE FINDINGS  Limited sagittal imaging of the cervical spine is grossly negative.  No  sagittal T2 weighted imaging of the thoracic spine was obtained. Axial thoracic spine imaging is degraded by motion despite repeated imaging attempts.  There is a pathologic fracture of the T3 vertebral body with severe loss of height. There is mild retropulsion of bone. There is subsequent mild spinal stenosis but no definite cord compression.  Adjacent to this level there is abnormal signal in the right T4 pedicle and posterior vertebral body compatible with metastatic disease. The right T4 neural foramen may be affected, but there is not definitely additional epidural tumor at this level. No cord compression. There is superimposed degenerative facet hypertrophy at this level.  No metastatic disease identified in T1, T2, or from the T5 to the T10 thoracic vertebrae.  There is a right paracentral disc herniation at T9-T10 resulting in effaced ventral CSF space but no significant spinal stenosis (series 7, image 29).  There is a pathologic moderate compression fracture of the T11 vertebral body. There is mild associated posterior disc osteophyte protrusion at T11-T12 narrowing the ventral CSF space, but no significant spinal stenosis or cord compression (note superimposed mild facet hypertrophy).  No metastasis identified in the T12 vertebra.  No spinal cord signal abnormality identified. Conus medullaris better depicted on the lumbar images described below.  Abnormal dependent increased signal in  the right lung has increased. There is now a small layering right pleural effusion. Stable visualized upper abdominal viscera.  MR LUMBAR SPINE FINDINGS  Sagittal and axial images degraded by motion despite repeated imaging attempts.  Normal lumbar segmentation. No lumbar metastatic disease identified from the L1 level to the L3 level.  Moderate pathologic compression fracture of L4 with tumor involving the left pedicle and lamina. There is evidence of early epidural tumor at this level, chiefly at the left L4 neural foramen (series 12, image 10). Multifactorial moderate spinal and left lateral recess stenosis occur. No L3 foraminal involvement.  Previous left laminectomy at L4-L5. No definite L5 metastatic disease.  Normal non contrast appearance of the conus medullaris. Detail of the cauda equina Limited due to motion.  Central tumor in the sacrum eccentric to the right affecting the S1 and S2 levels. There is epidural tumor along the course of the exiting right S1 nerve (Series 12, image 3 and series 15, image 32). No sacral spinal stenosis.  Stable visualized abdominal viscera, including aortic aneurysm.  IMPRESSION: MR THORACIC SPINE IMPRESSION  1. Metastatic disease involving the T3, T4, and T11 vertebral bodies with pathologic compression fractures at T3 and T11. Mild multifactorial spinal stenosis at these levels but no thoracic cord compression. Suspect early epidural tumor at the right T4 neural foramen. 2. Small layering right pleural effusion. Increased abnormal signal in the dependent right lung.  MR LUMBAR SPINE IMPRESSION  1. Lumbar metastatic disease limited to the L4 vertebral body with pathologic compression fracture. Subsequent multifactorial moderate L3-L4 spinal and left lateral recess stenosis. Suspect early epidural tumor at the left L4 neuroforamen. 2. Midline to right of midline tumor in the sacrum at S1 and S2. Epidural tumor affecting the course of the exiting right S1 nerve.    Electronically Signed   By: Lars Pinks M.D.   On: 11/25/2013 14:43   Mr Hip Right Wo Contrast  11/25/2013   CLINICAL DATA:  Metastatic lung cancer. Pathologic fracture of the lesser trochanter of the proximal right femur.  EXAM: MRI OF THE RIGHT HIP WITHOUT CONTRAST  TECHNIQUE: Multiplanar, multisequence MR imaging was performed. No intravenous contrast was administered.  COMPARISON:  Radiographs dated 11/23/2013 and CT scan and PET scan dated 11/22/2013  FINDINGS: There is a pathologic fracture of the right lesser trochanter with avulsion of the insertion of iliopsoas tendon with edema and/or hemorrhage in the adjacent soft tissues. There is also a 13 mm metastasis in the posterior superior aspect of the left femoral head.  There is a 5.2 x 2.4 cm destructive mass in the right side of the first sacral segment touching the right S1 nerve.  There are several small lymph nodes in the pelvis which are not pathologically enlarged but these could represent metastatic disease. These are seen on image number 5 and image number 16 of series 3.  IMPRESSION: Metastatic lesions in the sacrum, left femoral head and in the right lesser trochanter with a pathologic fracture and avulsion of the insertion of the right iliopsoas tendon.   Electronically Signed   By: Rozetta Nunnery M.D.   On: 11/25/2013 13:44    Scheduled Meds: . beta carotene w/minerals  1 tablet Oral Daily  . cefTRIAXone (ROCEPHIN)  IV  1 g Intravenous QHS  . enoxaparin (LOVENOX) injection  40 mg Subcutaneous QHS  . levothyroxine  50 mcg Oral QAC breakfast  . lisinopril  10 mg Oral Daily  . metoprolol succinate  100 mg Oral Daily  . nicotine  14 mg Transdermal Daily   Continuous Infusions: . sodium chloride 75 mL/hr at 11/25/13 1511    Principal Problem:   Pathologic fracture Active Problems:   Mixed hyperlipidemia   Tobacco use disorder   Essential hypertension, benign   Metastatic lung cancer (metastasis from lung to other site)    Compression fracture of L4 lumbar vertebra   UTI (lower urinary tract infection)   Hypothyroidism   Leukocytosis   Thrombocytosis   Severe malnutrition    Time spent: 25 minutes.     Hosie Poisson  [facility] Pager [phone]. If 7PM-7AM, please contact night-coverage at [email or web address], password [email or web address] 11/26/2013, 3:15 PM  LOS: 4 days

## 2013-11-26 NOTE — Progress Notes (Signed)
11/26/13 0200 Nursing  Dr Baltazar Najjar notified of patient's continued combativeness. Order received for haldol x 1 dose. Will continue to monitor patient.

## 2013-11-26 NOTE — Progress Notes (Signed)
   Subjective: Hospital day - 4 Patient's daughter at bedside   Patient seen in rounds with Dr. Wynelle Link. Patient is confused on rounds this morning.  The daughter states that she has already pulled out her IV twice. She was given a dose of Haldol last night and the RN staff said that she slept a few hours. Dr. Wynelle Link discussed the findings of the scans with the daughter this morning.  Objective: Vital signs in last 24 hours: Temp:  [98.3 F (36.8 C)-99.2 F (37.3 C)] 98.3 F (36.8 C) (05/19 0635) Pulse Rate:  [75-89] 89 (05/19 0635) Resp:  [16-18] 16 (05/19 0635) BP: (145-199)/(77-92) 194/87 mmHg (05/19 0810) SpO2:  [95 %-97 %] 96 % (05/19 0635)   Labs:  Recent Labs  11/25/13 0435  HGB 11.1*    Recent Labs  11/25/13 0435  WBC 14.6*  RBC 3.91  HCT 33.4*  PLT 410*    Recent Labs  11/25/13 0435 11/26/13 0421  NA 139 138  K 3.5* 4.0  CL 100 102  CO2 24 22  BUN 10 11  CREATININE 0.74 0.72  GLUCOSE 107* 104*  CALCIUM 8.5 8.3*    Past Medical History  Diagnosis Date  . Hypertension   . OA (osteoarthritis)   . Hypothyroidism   . Undiagnosed cardiac murmurs   . Hx-TIA (transient ischemic attack)   . Clavicle fracture   . Skin cancer   . Bone cancer 11/20/13  . Lung cancer 11/22/13    Assessment/Plan: Hospital day - 4 Principal Problem:   Pathologic fracture Active Problems:   Mixed hyperlipidemia   Tobacco use disorder   Essential hypertension, benign   Metastatic lung cancer (metastasis from lung to other site)   Compression fracture of L4 lumbar vertebra   UTI (lower urinary tract infection)   Hypothyroidism   Leukocytosis   Thrombocytosis   Severe malnutrition  Estimated body mass index is 25.39 kg/(m^2) as calculated from the following:   Height as of this encounter: 5\' 4"  (1.626 m).   Weight as of this encounter: 67.132 kg (148 lb).  Assessment/Plan: 1) Pathologic fracture and metastatic of the right lesser trochanter with avulsion -  Dr. Wynelle Link discussed lesion in the hip with the daughter this morning on rounds.  The patient may be WBAT to the right leg.  No surgery is indicated.  Question about radiation to the area if Oncology agrees.  2) Metastatic lesion in the left femoral head - The patient may be WBAT to the left leg. No surgery is indicated.  Question about radiation to the area if Oncology agrees.  3) Lumbar metastatic disease limited to the L4 vertebral body with pathologic compression fracture  4) Metastatic disease involving the T3, T4, and T11 vertebral bodies with pathologic compression fractures at T3 and T11  Lesions noted in the thoracic and lumbar spine.  Noted compression fractures.  Nothing that appears to be surgical in nature unless her exam or status changes.  Arlee Muslim, PA-C Orthopaedic Surgery 11/26/2013, 8:20 AM

## 2013-11-27 DIAGNOSIS — I359 Nonrheumatic aortic valve disorder, unspecified: Secondary | ICD-10-CM

## 2013-11-27 LAB — BASIC METABOLIC PANEL
BUN: 17 mg/dL (ref 6–23)
CO2: 20 mEq/L (ref 19–32)
Calcium: 8.5 mg/dL (ref 8.4–10.5)
Chloride: 103 mEq/L (ref 96–112)
Creatinine, Ser: 0.61 mg/dL (ref 0.50–1.10)
GFR calc Af Amer: >90 mL/min (ref >90)
GFR, EST NON AFRICAN AMERICAN: 85 mL/min — AB (ref >90)
GLUCOSE: 97 mg/dL (ref 70–99)
POTASSIUM: 3.9 meq/L (ref 3.7–5.3)
Sodium: 139 mEq/L (ref 137–147)

## 2013-11-27 LAB — PROTIME-INR
INR: 1.13 (ref 0.00–1.49)
Prothrombin Time: 14.3 seconds (ref 11.6–15.2)

## 2013-11-27 LAB — CBC
HEMATOCRIT: 34.8 % — AB (ref 36.0–46.0)
HEMOGLOBIN: 11.5 g/dL — AB (ref 12.0–15.0)
MCH: 28.5 pg (ref 26.0–34.0)
MCHC: 33 g/dL (ref 30.0–36.0)
MCV: 86.4 fL (ref 78.0–100.0)
Platelets: 455 10*3/uL — ABNORMAL HIGH (ref 150–400)
RBC: 4.03 MIL/uL (ref 3.87–5.11)
RDW: 14.2 % (ref 11.5–15.5)
WBC: 16 10*3/uL — ABNORMAL HIGH (ref 4.0–10.5)

## 2013-11-27 LAB — APTT: aPTT: 44 seconds — ABNORMAL HIGH (ref 24–37)

## 2013-11-27 MED ORDER — FENTANYL 12 MCG/HR TD PT72
12.5000 ug | MEDICATED_PATCH | TRANSDERMAL | Status: DC
  Administered 2013-11-27: 12.5 ug via TRANSDERMAL

## 2013-11-27 MED ORDER — LISINOPRIL 20 MG PO TABS
20.0000 mg | ORAL_TABLET | Freq: Every day | ORAL | Status: DC
  Administered 2013-11-28: 20 mg via ORAL
  Filled 2013-11-27 (×2): qty 1

## 2013-11-27 NOTE — Progress Notes (Signed)
Patient ID: Nichole Werner, female   DOB: [birth year], 79 y.o.   MRN: [id number]   Pt has been scheduled for IR procedure with anesthesia 11/29/13 at [place] will need to be at [facility] [place] 5/22 by ambulance And will return to [facility] after procedure  IR PA will see pt in am for order preparation and consent

## 2013-11-27 NOTE — Progress Notes (Signed)
OT Cancellation Note  Patient Details Name: AUDRYANNA ZURITA MRN: [id number] DOB: [birth year]   Cancelled Treatment:    Reason Eval/Treat Not Completed: Other (comment) As per PT note, pt still on bedrest per PA note and possible kyphoplasty tomorrow. Please update activity level as appropriate.  Alycia Patten Dshaun Reppucci [phone] 11/27/2013, 11:40 AM

## 2013-11-27 NOTE — H&P (Signed)
Nichole Werner is an 79 y.o. female.   Chief Complaint: Pt with new onset low back pain last week Denies injury Known lung cancer with bony metastasis MRI reveals new edematous fractures at Thoracic 3; Thoracic 11; Lumbar 4 and Rt sacrum Also noted is Rt sacral lesion/tumor Pt states her pain is mostly after she is up walking for a period of time Pain is worse in low back; does not radiate to legs Dr Marin Olp has requested consult for Vertebroplasty/kyphoplasty Dr Barbie Banner has reviewed films and examined pt. Dr Estanislado Pandy has also reviewed films and feels pt is a candidate for T11/ L4 Kyphoplasty with Sacroplasty and Rt sacral tumor ablation Dr Marin Olp now aware of plan and is agreeable.  Scheduler is coordinating anesthesia for this procedure to be performed at [place] with Dr Judy Pimple for Fri 5/22  HPI: HTN; OA; Lung Ca; Bone Ca; skin ca; hypothyroidism; hx TIA  Past Medical History  Diagnosis Date  . Hypertension   . OA (osteoarthritis)   . Hypothyroidism   . Undiagnosed cardiac murmurs   . Hx-TIA (transient ischemic attack)   . Clavicle fracture   . Skin cancer   . Bone cancer 11/20/13  . Lung cancer 11/22/13    Past Surgical History  Procedure Laterality Date  . Laminectomy  1973    with disc removal  . Abdominal hysterectomy    . Leg surgery      "rebuilt left leg"    Family History  Problem Relation Age of Onset  . Heart disease Sister     CABG  . Kidney failure Father   . Kidney failure Mother    Social History:  reports that she has been smoking.  She has never used smokeless tobacco. She reports that she does not drink alcohol or use illicit drugs.  Allergies:  Allergies  Allergen Reactions  . Benadryl [Diphenhydramine]     Speeds everything up  . Benzodiazepines     Doesn't know  . Contrast Media [Iodinated Diagnostic Agents]     Pt states it burns  . Morphine And Related     Keeps her awake  . Latex Rash    Medications Prior to  Admission  Medication Sig Dispense Refill  . beta carotene w/minerals (OCUVITE) tablet Take 1 tablet by mouth daily.      Marland Kitchen HYDROcodone-acetaminophen (NORCO/VICODIN) 5-325 MG per tablet Take 1 tablet by mouth every 6 (six) hours as needed for moderate pain.  30 tablet  0  . levothyroxine (SYNTHROID, LEVOTHROID) 50 MCG tablet Take 50 mcg by mouth daily before breakfast.      . lisinopril (PRINIVIL,ZESTRIL) 10 MG tablet Take 10 mg by mouth daily.      . metoprolol succinate (TOPROL-XL) 100 MG 24 hr tablet Take 100 mg by mouth daily. Take with or immediately following a meal.      . naproxen sodium (ANAPROX) 220 MG tablet Take 440 mg by mouth 2 (two) times daily as needed (pain).       . polyethylene glycol (MIRALAX / GLYCOLAX) packet Take 17 g by mouth 2 (two) times daily as needed for moderate constipation.      . cephALEXin (KEFLEX) 500 MG capsule Take 1 capsule (500 mg total) by mouth 3 (three) times daily.  21 capsule  0    Results for orders placed during the hospital encounter of 11/22/13 (from the past 48 hour(s))  BASIC METABOLIC PANEL     Status: Abnormal   Collection Time  11/26/13  4:21 AM      Result Value Ref Range   Sodium 138  137 - 147 mEq/L   Potassium 4.0  3.7 - 5.3 mEq/L   Chloride 102  96 - 112 mEq/L   CO2 22  19 - 32 mEq/L   Glucose, Bld 104 (*) 70 - 99 mg/dL   BUN 11  6 - 23 mg/dL   Creatinine, Ser 0.72  0.50 - 1.10 mg/dL   Calcium 8.3 (*) 8.4 - 10.5 mg/dL   GFR calc non Af Amer 81 (*) >90 mL/min   GFR calc Af Amer >90  >90 mL/min   Comment: (NOTE)     The eGFR has been calculated using the CKD EPI equation.     This calculation has not been validated in all clinical situations.     eGFR's persistently <90 mL/min signify possible Chronic Kidney     Disease.  CBC     Status: Abnormal   Collection Time    11/27/13  4:26 AM      Result Value Ref Range   WBC 16.0 (*) 4.0 - 10.5 K/uL   RBC 4.03  3.87 - 5.11 MIL/uL   Hemoglobin 11.5 (*) 12.0 - 15.0 g/dL   HCT  34.8 (*) 36.0 - 46.0 %   MCV 86.4  78.0 - 100.0 fL   MCH 28.5  26.0 - 34.0 pg   MCHC 33.0  30.0 - 36.0 g/dL   RDW 14.2  11.5 - 15.5 %   Platelets 455 (*) 150 - 400 K/uL  BASIC METABOLIC PANEL     Status: Abnormal   Collection Time    11/27/13  4:26 AM      Result Value Ref Range   Sodium 139  137 - 147 mEq/L   Potassium 3.9  3.7 - 5.3 mEq/L   Chloride 103  96 - 112 mEq/L   CO2 20  19 - 32 mEq/L   Glucose, Bld 97  70 - 99 mg/dL   BUN 17  6 - 23 mg/dL   Creatinine, Ser 0.61  0.50 - 1.10 mg/dL   Calcium 8.5  8.4 - 10.5 mg/dL   GFR calc non Af Amer 85 (*) >90 mL/min   GFR calc Af Amer >90  >90 mL/min   Comment: (NOTE)     The eGFR has been calculated using the CKD EPI equation.     This calculation has not been validated in all clinical situations.     eGFR's persistently <90 mL/min signify possible Chronic Kidney     Disease.  APTT     Status: Abnormal   Collection Time    11/27/13  4:26 AM      Result Value Ref Range   aPTT 44 (*) 24 - 37 seconds   Comment:            IF BASELINE aPTT IS ELEVATED,     SUGGEST PATIENT RISK ASSESSMENT     BE USED TO DETERMINE APPROPRIATE     ANTICOAGULANT THERAPY.  PROTIME-INR     Status: None   Collection Time    11/27/13  4:26 AM      Result Value Ref Range   Prothrombin Time 14.3  11.6 - 15.2 seconds   INR 1.13  0.00 - 1.49   Mr Thoracic Spine Wo Contrast  11/25/2013   CLINICAL DATA:  79 year old female with metastatic lung cancer. Pathologic fractures. Staging.  EXAM: MRI THORACIC AND LUMBAR SPINE WITHOUT CONTRAST  TECHNIQUE:  Multiplanar and multiecho pulse sequences of the thoracic and lumbar spine were obtained without intravenous contrast.  COMPARISON:  Hip MRI 11/25/2013, reported separately. Chest abdomen pelvis CT 11/22/2013.  FINDINGS: The examination had to be discontinued prior to completion due to pain.  MR THORACIC SPINE FINDINGS  Limited sagittal imaging of the cervical spine is grossly negative.  No sagittal T2 weighted imaging  of the thoracic spine was obtained. Axial thoracic spine imaging is degraded by motion despite repeated imaging attempts.  There is a pathologic fracture of the T3 vertebral body with severe loss of height. There is mild retropulsion of bone. There is subsequent mild spinal stenosis but no definite cord compression.  Adjacent to this Werner there is abnormal signal in the right T4 pedicle and posterior vertebral body compatible with metastatic disease. The right T4 neural foramen may be affected, but there is not definitely additional epidural tumor at this Werner. No cord compression. There is superimposed degenerative facet hypertrophy at this Werner.  No metastatic disease identified in T1, T2, or from the T5 to the T10 thoracic vertebrae.  There is a right paracentral disc herniation at T9-T10 resulting in effaced ventral CSF space but no significant spinal stenosis (series 7, image 29).  There is a pathologic moderate compression fracture of the T11 vertebral body. There is mild associated posterior disc osteophyte protrusion at T11-T12 narrowing the ventral CSF space, but no significant spinal stenosis or cord compression (note superimposed mild facet hypertrophy).  No metastasis identified in the T12 vertebra.  No spinal cord signal abnormality identified. Conus medullaris better depicted on the lumbar images described below.  Abnormal dependent increased signal in the right lung has increased. There is now a small layering right pleural effusion. Stable visualized upper abdominal viscera.  MR LUMBAR SPINE FINDINGS  Sagittal and axial images degraded by motion despite repeated imaging attempts.  Normal lumbar segmentation. No lumbar metastatic disease identified from the L1 Werner to the L3 Werner.  Moderate pathologic compression fracture of L4 with tumor involving the left pedicle and lamina. There is evidence of early epidural tumor at this Werner, chiefly at the left L4 neural foramen (series 12, image 10).  Multifactorial moderate spinal and left lateral recess stenosis occur. No L3 foraminal involvement.  Previous left laminectomy at L4-L5. No definite L5 metastatic disease.  Normal non contrast appearance of the conus medullaris. Detail of the cauda equina Limited due to motion.  Central tumor in the sacrum eccentric to the right affecting the S1 and S2 levels. There is epidural tumor along the course of the exiting right S1 nerve (Series 12, image 3 and series 15, image 32). No sacral spinal stenosis.  Stable visualized abdominal viscera, including aortic aneurysm.  IMPRESSION: MR THORACIC SPINE IMPRESSION  1. Metastatic disease involving the T3, T4, and T11 vertebral bodies with pathologic compression fractures at T3 and T11. Mild multifactorial spinal stenosis at these levels but no thoracic cord compression. Suspect early epidural tumor at the right T4 neural foramen. 2. Small layering right pleural effusion. Increased abnormal signal in the dependent right lung.  MR LUMBAR SPINE IMPRESSION  1. Lumbar metastatic disease limited to the L4 vertebral body with pathologic compression fracture. Subsequent multifactorial moderate L3-L4 spinal and left lateral recess stenosis. Suspect early epidural tumor at the left L4 neuroforamen. 2. Midline to right of midline tumor in the sacrum at S1 and S2. Epidural tumor affecting the course of the exiting right S1 nerve.   Electronically Signed   By: Truman Hayward  [place] Crane M.D.   On: 11/25/2013 14:43   Mr Lumbar Spine Wo Contrast  11/25/2013   CLINICAL DATA:  79 year old female with metastatic lung cancer. Pathologic fractures. Staging.  EXAM: MRI THORACIC AND LUMBAR SPINE WITHOUT CONTRAST  TECHNIQUE: Multiplanar and multiecho pulse sequences of the thoracic and lumbar spine were obtained without intravenous contrast.  COMPARISON:  Hip MRI 11/25/2013, reported separately. Chest abdomen pelvis CT 11/22/2013.  FINDINGS: The examination had to be discontinued prior to completion due to  pain.  MR THORACIC SPINE FINDINGS  Limited sagittal imaging of the cervical spine is grossly negative.  No sagittal T2 weighted imaging of the thoracic spine was obtained. Axial thoracic spine imaging is degraded by motion despite repeated imaging attempts.  There is a pathologic fracture of the T3 vertebral body with severe loss of height. There is mild retropulsion of bone. There is subsequent mild spinal stenosis but no definite cord compression.  Adjacent to this Werner there is abnormal signal in the right T4 pedicle and posterior vertebral body compatible with metastatic disease. The right T4 neural foramen may be affected, but there is not definitely additional epidural tumor at this Werner. No cord compression. There is superimposed degenerative facet hypertrophy at this Werner.  No metastatic disease identified in T1, T2, or from the T5 to the T10 thoracic vertebrae.  There is a right paracentral disc herniation at T9-T10 resulting in effaced ventral CSF space but no significant spinal stenosis (series 7, image 29).  There is a pathologic moderate compression fracture of the T11 vertebral body. There is mild associated posterior disc osteophyte protrusion at T11-T12 narrowing the ventral CSF space, but no significant spinal stenosis or cord compression (note superimposed mild facet hypertrophy).  No metastasis identified in the T12 vertebra.  No spinal cord signal abnormality identified. Conus medullaris better depicted on the lumbar images described below.  Abnormal dependent increased signal in the right lung has increased. There is now a small layering right pleural effusion. Stable visualized upper abdominal viscera.  MR LUMBAR SPINE FINDINGS  Sagittal and axial images degraded by motion despite repeated imaging attempts.  Normal lumbar segmentation. No lumbar metastatic disease identified from the L1 Werner to the L3 Werner.  Moderate pathologic compression fracture of L4 with tumor involving the left  pedicle and lamina. There is evidence of early epidural tumor at this Werner, chiefly at the left L4 neural foramen (series 12, image 10). Multifactorial moderate spinal and left lateral recess stenosis occur. No L3 foraminal involvement.  Previous left laminectomy at L4-L5. No definite L5 metastatic disease.  Normal non contrast appearance of the conus medullaris. Detail of the cauda equina Limited due to motion.  Central tumor in the sacrum eccentric to the right affecting the S1 and S2 levels. There is epidural tumor along the course of the exiting right S1 nerve (Series 12, image 3 and series 15, image 32). No sacral spinal stenosis.  Stable visualized abdominal viscera, including aortic aneurysm.  IMPRESSION: MR THORACIC SPINE IMPRESSION  1. Metastatic disease involving the T3, T4, and T11 vertebral bodies with pathologic compression fractures at T3 and T11. Mild multifactorial spinal stenosis at these levels but no thoracic cord compression. Suspect early epidural tumor at the right T4 neural foramen. 2. Small layering right pleural effusion. Increased abnormal signal in the dependent right lung.  MR LUMBAR SPINE IMPRESSION  1. Lumbar metastatic disease limited to the L4 vertebral body with pathologic compression fracture. Subsequent multifactorial moderate L3-L4 spinal and left lateral recess stenosis. Suspect  early epidural tumor at the left L4 neuroforamen. 2. Midline to right of midline tumor in the sacrum at S1 and S2. Epidural tumor affecting the course of the exiting right S1 nerve.   Electronically Signed   By: Lars Pinks M.D.   On: 11/25/2013 14:43   Mr Hip Right Wo Contrast  11/25/2013   CLINICAL DATA:  Metastatic lung cancer. Pathologic fracture of the lesser trochanter of the proximal right femur.  EXAM: MRI OF THE RIGHT HIP WITHOUT CONTRAST  TECHNIQUE: Multiplanar, multisequence MR imaging was performed. No intravenous contrast was administered.  COMPARISON:  Radiographs dated 11/23/2013 and CT  scan and PET scan dated 11/22/2013  FINDINGS: There is a pathologic fracture of the right lesser trochanter with avulsion of the insertion of iliopsoas tendon with edema and/or hemorrhage in the adjacent soft tissues. There is also a 13 mm metastasis in the posterior superior aspect of the left femoral head.  There is a 5.2 x 2.4 cm destructive mass in the right side of the first sacral segment touching the right S1 nerve.  There are several small lymph nodes in the pelvis which are not pathologically enlarged but these could represent metastatic disease. These are seen on image number 5 and image number 16 of series 3.  IMPRESSION: Metastatic lesions in the sacrum, left femoral head and in the right lesser trochanter with a pathologic fracture and avulsion of the insertion of the right iliopsoas tendon.   Electronically Signed   By: Rozetta Nunnery M.D.   On: 11/25/2013 13:44    Review of Systems  Constitutional: Positive for weight loss. Negative for fever.  Respiratory: Negative for shortness of breath.   Cardiovascular: Negative for chest pain.  Gastrointestinal: Negative for nausea, vomiting and abdominal pain.  Musculoskeletal: Positive for back pain, joint pain and neck pain.  Neurological: Positive for weakness. Negative for dizziness.  Psychiatric/Behavioral: Positive for substance abuse.       Smoker    Blood pressure 157/70, pulse 74, temperature 98.3 F (36.8 C), temperature source Oral, resp. rate 20, height 5' 4"  (1.626 m), weight 67.132 kg (148 lb), SpO2 98.00%. Physical Exam  Constitutional: She is oriented to person, place, and time. She appears well-developed and well-nourished.  Cardiovascular: Normal rate and regular rhythm.   Murmur heard. Respiratory: Effort normal. She has no wheezes.  GI: Soft. There is no tenderness.  Musculoskeletal: Normal range of motion.  Very tender in low back and R sacral area  Neurological: She is alert and oriented to person, place, and time.   Skin: Skin is warm and dry.  Psychiatric: She has a normal mood and affect. Her behavior is normal. Judgment and thought content normal.  Pt alert and able to answer all questions appropriately     Assessment/Plan New low back pain without injury Hx lung Ca; bone mets MRI reveals T3; T11; L4 and sacral fractures also Rt sacral tumor Films and case has been reviewed with Dr Barbie Banner and Dr Estanislado Pandy Pt being scheduled for T11/L4 Kyphoplasty with Sacroplasty and Rt sacral tumor ablation To be performed with anesthesia at [place] working on this now Dr Marin Olp aware of plan for pt and is agreeable.  [place] Drafts 11/27/2013, 12:24 PM

## 2013-11-27 NOTE — Progress Notes (Signed)
[facility] PROGRESS NOTE  VERONA HARTSHORN WCH:[id number] DOB: 12-Jul-1934 DOA: 11/22/2013 PCP: Gavin Pound, MD Interim summary: Nichole Werner is a 79 y.o. female with recent diagnosis of Metastatic Lung Cancer who was sent for a Pet Scan this Afternoon which returned revealing diffuse metastatic Disease and a RUL Lung Tumor, as well as a Pathologic Fracture of the Right Hip. She had been having increased low back pain for several weeks and was sent for an MRI by Dr. Nelva Bush who interpreted a neoplastic process of the L4 Compression fracture which was found. She report having right hip pain for the past 4 days and she was sent for an PET scan today by Dr. Marin Olp, the results of which revealed a pathologic fracture of the right hip. She was referred tot he ED. She denies any history of trauma or fall. She denies having fevers chills or night sweats, she does report having a loss of appetite over the past few months and an unintentional weight loss of 20 pounds over the past 6 months. She was admitted to medical service for further management. MRI of the thoracic , lumbar and right hip ordered. Results of the right hip discussed the patient's daughter at bedside. MRI of the lumbar spine showed L4 pathological compression fracture and moderate stenosis,. MRI of the thoracic spine showed  Pathological fracture of the T3 and T 11. IR consult put in to see if she could get kyphoplasty. MRI of the right hip showed several areas of metastatic disease in the bones and surgery is not indicated. Recommendations are for pain control and bedrest for now. Called Dr Marin Olp office on 5/19, he is out of office today. Plan to call him on 5/20 and see if we need to consult radiation oncology for initiating radiation treatments. Currently patient is confused with bouts of agitation, pulling out IV lines and foley catheter. She was started on haldol with some relief.     Assessment/Plan: Pathological fracture of the  right hip: - secondary to metastatic disease. Orthopedics following - pain control and conservative management as per discussions.  - MRI of the right hip done and showed metastatic disease.  - no surgical plans per Ortho - Dr.Ennever to discuss XRT with patient and family  Widely Metastatic lung cancer; - Dr Marin Olp following and appreciate his recommendations.  - she doesn't want any aggressive measures at this time.  - DNR now -should we consider residential Hospice, will d/w Dr.Ennever  L4 pathological compression Fx -Multiple thoracic compression fx noted on MRI -IR following evaluating for kyphoplasty of L4 lesion -[facility] on [facility]  Extensive bony mets -Onc to d/w pt and family about XRT  HTN  -BP high, increase lisinopril, continue Toprol, hydralazine prn  Hypothyroid-TSH within normal limits. and continue Levothyroxine,.   Leukocytosis- Reactive, malignancy -urine Cx negative, stopped ceftriaxone  Tobacco Use Disorder- Nicotine Patch daily.   DVT prophylaxis: lovenox  Code Status: full code Family Communication:none at bedside.  Disposition Plan: will likely need SNF   Consultants:  Ortho  oncology  Procedures:  MRI T/L spine.   Antibiotics:  Rocephin for UTI. Stopped 5/18/   HPI/Subjective: Continues to have low back pain  Objective: Filed Vitals:   11/27/13 1344  BP: 167/76  Pulse: 81  Temp: 98.2 F (36.8 C)  Resp: 18    Intake/Output Summary (Last 24 hours) at 11/27/13 1547 Last data filed at 11/27/13 1100  Gross per 24 hour  Intake  50 ml  Output    850 ml  Net   -800 ml   Filed Weights   11/23/13 0000  Weight: 67.132 kg (148 lb)    Exam:   General: alert and resting.   Cardiovascular: s1s2  Respiratory: ctab  Abdomen: soft NT ND BS+  Musculoskeletal: painful ROM of the LE, no pedal edema.   Data Reviewed: Basic Metabolic Panel:  Recent Labs Lab 11/22/13 2045 11/23/13 0522 11/25/13 0435  11/26/13 0421 11/27/13 0426  NA 135* 136* 139 138 139  K 4.1 4.5 3.5* 4.0 3.9  CL 96 99 100 102 103  CO2 26 27 24 22 20   GLUCOSE 102* 97 107* 104* 97  BUN 20 16 10 11 17   CREATININE 0.94 0.85 0.74 0.72 0.61  CALCIUM 9.4 8.8 8.5 8.3* 8.5   Liver Function Tests:  Recent Labs Lab 11/22/13 2045 11/25/13 0435  AST 15 15  ALT 10 10  ALKPHOS 106 112  BILITOT 0.4 0.3  PROT 7.5 6.7  ALBUMIN 3.2* 2.6*   No results found for this basename: LIPASE, AMYLASE,  in the last 168 hours No results found for this basename: AMMONIA,  in the last 168 hours CBC:  Recent Labs Lab 11/22/13 2045 11/23/13 0522 11/25/13 0435 11/27/13 0426  WBC 16.1* 15.6* 14.6* 16.0*  NEUTROABS 11.5*  --   --   --   HGB 11.1* 10.5* 11.1* 11.5*  HCT 33.5* 32.8* 33.4* 34.8*  MCV 86.1 86.8 85.4 86.4  PLT 416* 364 410* 455*   Cardiac Enzymes: No results found for this basename: CKTOTAL, CKMB, CKMBINDEX, TROPONINI,  in the last 168 hours BNP (last 3 results) No results found for this basename: PROBNP,  in the last 8760 hours CBG:  Recent Labs Lab 11/22/13 1102  GLUCAP 110*    Recent Results (from the past 240 hour(s))  URINE CULTURE     Status: None   Collection Time    11/23/13  8:11 PM      Result Value Ref Range Status   Specimen Description URINE, CATHETERIZED   Final   Special Requests Normal   Final   Culture  Setup Time     Final   Value: 11/24/2013 03:35     Performed at [place]     Final   Value: NO GROWTH     Performed at [facility]   Culture     Final   Value: NO GROWTH     Performed at [facility]   Report Status 11/25/2013 FINAL   Final     Studies: No results found.  Scheduled Meds: . beta carotene w/minerals  1 tablet Oral Daily  . cefTRIAXone (ROCEPHIN)  IV  1 g Intravenous QHS  . enoxaparin (LOVENOX) injection  40 mg Subcutaneous QHS  . fentaNYL  12.5 mcg Transdermal Q72H  . levothyroxine  50 mcg Oral QAC breakfast  .  lisinopril  10 mg Oral Daily  . metoprolol succinate  100 mg Oral Daily  . nicotine  14 mg Transdermal Daily   Continuous Infusions:    Principal Problem:   Pathologic fracture Active Problems:   Mixed hyperlipidemia   Tobacco use disorder   Essential hypertension, benign   Metastatic lung cancer (metastasis from lung to other site)   Compression fracture of L4 lumbar vertebra   UTI (lower urinary tract infection)   Hypothyroidism   Leukocytosis   Thrombocytosis   Severe malnutrition    Time  spent: 25 minutes.     [facility] Pager [phone]. If 7PM-7AM, please contact night-coverage at [email or web address], password [facility] 11/27/2013, 3:47 PM  LOS: 5 days

## 2013-11-27 NOTE — Progress Notes (Signed)
She is doing about the same. Her MRIs have all been done. She is not going to have surgery for the right hip.  I do think that kyphoplasty with cement augmentation would be a good idea. Particularly, this could be helpful for pain at L4. I think radiologist see her.  I do also think that radiation oncology would be helpful. I believe that this could help with the right hip area.  Am not sure what she can do physically. It seems as if every time physical therapy sees her, they cannot do an evaluation for one reason or another.  She, I still think would benefit from radiation. This can be done with a brief course that would not impact her quality of life.  I think a fentanyl patch would not be a bad idea. Hopefully she can tolerate this.  Her labs don't look all that bad.  Her blood pressure has been on the high side.  I really can't see anything different on her physical exam. She has some moments of clarity. She has some episodes of mild confusion which might just be from her being in the hospital.  As far as discharge goes, she really needs to be in the hospital for the kyphoplasty if that can be done. Also, I still think that radiation oncology would be quite helpful. I am not sure that it she would agree with this but it would be useful.  I will continue to talk with her family. They go to church with me. I am sure that they will feel that radiation therapy would be helpful.  Pete E.

## 2013-11-27 NOTE — Progress Notes (Cosign Needed)
PT/OT Cancellation Note  Patient Details Name: Nichole Werner MRN: [id number] DOB: 05/09/1935   Cancelled Treatment:    Reason Eval/Treat Not Completed: Medical issues which prohibited therapy  Pt with continue bedrest per ortho PA in note and may have possible kyphoplasty tomorrow.  Please update activity level as appropriate.   Junius Argyle 11/27/2013, 11:32 AM Carmelia Bake, PT, DPT 11/27/2013 Pager: [phone]

## 2013-11-28 DIAGNOSIS — S3210XA Unspecified fracture of sacrum, initial encounter for closed fracture: Secondary | ICD-10-CM

## 2013-11-28 DIAGNOSIS — S322XXA Fracture of coccyx, initial encounter for closed fracture: Secondary | ICD-10-CM

## 2013-11-28 MED ORDER — CEFAZOLIN SODIUM-DEXTROSE 2-3 GM-% IV SOLR
2.0000 g | INTRAVENOUS | Status: DC
  Filled 2013-11-28: qty 50

## 2013-11-28 NOTE — Progress Notes (Signed)
PT Cancellation Note  Patient Details Name: Nichole Werner MRN: [id number] DOB: 29-Nov-1934   Cancelled Treatment:    Reason Eval/Treat Not Completed: Medical issues which prohibited therapy  Pt still on bedrest.  Will check back after scheduled kyphoplasty 5/22 likely Saturday.   Junius Argyle 11/28/2013, 8:12 AM

## 2013-11-28 NOTE — Progress Notes (Addendum)
Patient ID: Nichole Werner, female   DOB: [birth year], 79 y.o.   MRN: [id number] Pt still on schedule for KP/SP/sacral tumor ablation 5/22 at [facility]. Orders placed. Pt's daughter to sign consent later today. Pt's contrast "allergy" is only burning sensation (per pt) and does not involve itching , rash or breathing difficulties.

## 2013-11-28 NOTE — Progress Notes (Signed)
OT Cancellation Note  Patient Details Name: Nichole Werner MRN: [id number] DOB: 03-03-1935   Cancelled Treatment:    Reason Eval/Treat Not Completed: Other (comment)  Noted pt is on bedrest and plan is for KP on Friday.  Will check back tomorrow or Saturday.  Lesle Chris 11/28/2013, 7:24 AM Lesle Chris, OTR/L [phone] 11/28/2013

## 2013-11-28 NOTE — Care Management Note (Signed)
    Page 1 of 1   11/28/2013     2:44:15 PM CARE MANAGEMENT NOTE 11/28/2013  Patient:  Nichole Werner, Nichole Werner   Account Number:  [id number]  Date Initiated:  11/26/2013  Documentation initiated by:  [facility]  Subjective/Objective Assessment:   adm: R hip pain; no surgery-pathologic fracture and metastatic of the R lesser trochanter with avulsion.     Action/Plan:   SNF vs. HH   Anticipated DC Date:  11/28/2013   Anticipated DC Plan:  [place]  CM consult      Choice offered to / List presented to:             Status of service:  Completed, signed off Medicare Important Message given?  YES (If response is "NO", the following Medicare IM given date fields will be blank) Date Medicare IM given:  11/22/2013 Date Additional Medicare IM given:    Discharge Disposition:  [place]  Per UR Regulation:    If discussed at Long Length of Stay Meetings, dates discussed:    Comments:  11/28/13 14:30 CM notes pt is probable SNF/facility hospice afer kyphoplasty at [facility] 5/22.  No other CM needs were communicated.  Mariane Masters, BSN, Jearld Lesch [phone].  11/26/13 14:25 CM to room where pt is sleeping with sitter in place.  Will continue to follow for disposition.  Mariane Masters, BSN, CM [phone].

## 2013-11-28 NOTE — Progress Notes (Signed)
[facility] PROGRESS NOTE  Nichole Werner IZT:[id number] DOB: 1935/02/03 DOA: 11/22/2013 PCP: Gavin Pound, MD Interim summary: Nichole Werner is a 79 y.o. female with recent diagnosis of Metastatic Lung Cancer who was sent for a Pet Scan this Afternoon which returned revealing diffuse metastatic Disease and a RUL Lung Tumor, as well as a Pathologic Fracture of the Right Hip. She had been having increased low back pain for several weeks and was sent for an MRI by Dr. Nelva Bush who interpreted a neoplastic process of the L4 Compression fracture which was found. She report having right hip pain for the past 4 days and she was sent for an PET scan today by Dr. Marin Olp, the results of which revealed a pathologic fracture of the right hip. She was referred tot he ED. She denies any history of trauma or fall. She denies having fevers chills or night sweats, she does report having a loss of appetite over the past few months and an unintentional weight loss of 20 pounds over the past 6 months. She was admitted to medical service for further management. MRI of the thoracic , lumbar and right hip ordered. Results of the right hip discussed the patient's daughter at bedside. MRI of the lumbar spine showed L4 pathological compression fracture and moderate stenosis,. MRI of the thoracic spine showed  Pathological fracture of the T3 and T 11. IR consult put in to see if she could get kyphoplasty. MRI of the right hip showed several areas of metastatic disease in the bones and surgery is not indicated. Recommendations are for pain control and bedrest for now. Called Dr Marin Olp office on 5/19, he is out of office today. Plan to call him on 5/20 and see if we need to consult radiation oncology for initiating radiation treatments. Currently patient is confused with bouts of agitation, pulling out IV lines and foley catheter. She was started on haldol with some relief.     Assessment/Plan:  Pathological fracture of the  right hip: - secondary to metastatic disease. Orthopedics following - pain control and conservative management as per discussions.  - MRI of the right hip done and showed metastatic disease.  - no surgical plans per Ortho - Dr.Ennever to discuss XRT with patient and family  Widely Metastatic lung cancer; - Dr Marin Olp following and appreciate his recommendations.  - she doesn't want any aggressive measures at this time.  - DNR now - should we consider residential Hospice, will d/w Dr.Ennever  L4 /T11 pathological compression Fx, Sacral Fx -Multiple thoracic compression fx noted on MRI -IR following planning kyphoplasty of L4/T11 lesion, sacroplasty tomorrow at [facility]   Extensive bony mets -Onc to d/w pt and family about XRT  HTN  -BP high, continue lisinopril, continue Toprol, hydralazine prn  Hypothyroid-TSH within normal limits. and continue Levothyroxine,.   Leukocytosis- Reactive, malignancy -urine Cx negative, stopped ceftriaxone  Tobacco Use Disorder- Nicotine Patch daily.   DVT prophylaxis: lovenox  Code Status: full code Family Communication:none at bedside.  Disposition Plan: will likely need SNF   Consultants:  Ortho  oncology  Procedures:  MRI T/L spine.   Antibiotics:  Rocephin for UTI. Stopped 5/18/   HPI/Subjective: Continues to have low back pain, but overall better  Objective: Filed Vitals:   11/28/13 1353  BP: 176/84  Pulse: 71  Temp: 98.7 F (37.1 C)  Resp: 16    Intake/Output Summary (Last 24 hours) at 11/28/13 1628 Last data filed at 11/28/13 1425  Gross per 24 hour  Intake    120 ml  Output    300 ml  Net   -180 ml   Filed Weights   11/23/13 0000  Weight: 67.132 kg (148 lb)    Exam:   General: alert and resting.   Cardiovascular: s1s2  Respiratory: ctab  Abdomen: soft NT ND BS+  Musculoskeletal: painful ROM of the LE, no pedal edema.   Data Reviewed: Basic Metabolic Panel:  Recent Labs Lab 11/22/13 2045  11/23/13 0522 11/25/13 0435 11/26/13 0421 11/27/13 0426  NA 135* 136* 139 138 139  K 4.1 4.5 3.5* 4.0 3.9  CL 96 99 100 102 103  CO2 26 27 24 22 20   GLUCOSE 102* 97 107* 104* 97  BUN 20 16 10 11 17   CREATININE 0.94 0.85 0.74 0.72 0.61  CALCIUM 9.4 8.8 8.5 8.3* 8.5   Liver Function Tests:  Recent Labs Lab 11/22/13 2045 11/25/13 0435  AST 15 15  ALT 10 10  ALKPHOS 106 112  BILITOT 0.4 0.3  PROT 7.5 6.7  ALBUMIN 3.2* 2.6*   No results found for this basename: LIPASE, AMYLASE,  in the last 168 hours No results found for this basename: AMMONIA,  in the last 168 hours CBC:  Recent Labs Lab 11/22/13 2045 11/23/13 0522 11/25/13 0435 11/27/13 0426  WBC 16.1* 15.6* 14.6* 16.0*  NEUTROABS 11.5*  --   --   --   HGB 11.1* 10.5* 11.1* 11.5*  HCT 33.5* 32.8* 33.4* 34.8*  MCV 86.1 86.8 85.4 86.4  PLT 416* 364 410* 455*   Cardiac Enzymes: No results found for this basename: CKTOTAL, CKMB, CKMBINDEX, TROPONINI,  in the last 168 hours BNP (last 3 results) No results found for this basename: PROBNP,  in the last 8760 hours CBG:  Recent Labs Lab 11/22/13 1102  GLUCAP 110*    Recent Results (from the past 240 hour(s))  URINE CULTURE     Status: None   Collection Time    11/23/13  8:11 PM      Result Value Ref Range Status   Specimen Description URINE, CATHETERIZED   Final   Special Requests Normal   Final   Culture  Setup Time     Final   Value: 11/24/2013 03:35     Performed at [place]     Final   Value: NO GROWTH     Performed at [facility]   Culture     Final   Value: NO GROWTH     Performed at [facility]   Report Status 11/25/2013 FINAL   Final     Studies: No results found.  Scheduled Meds: . beta carotene w/minerals  1 tablet Oral Daily  . [START ON 11/29/2013]  ceFAZolin (ANCEF) IV  2 g Intravenous On Call  . enoxaparin (LOVENOX) injection  40 mg Subcutaneous QHS  . fentaNYL  12.5 mcg Transdermal Q72H   . levothyroxine  50 mcg Oral QAC breakfast  . lisinopril  20 mg Oral Daily  . metoprolol succinate  100 mg Oral Daily  . nicotine  14 mg Transdermal Daily   Continuous Infusions:    Principal Problem:   Pathologic fracture Active Problems:   Mixed hyperlipidemia   Tobacco use disorder   Essential hypertension, benign   Metastatic lung cancer (metastasis from lung to other site)   Compression fracture of L4 lumbar vertebra   UTI (lower urinary tract infection)   Hypothyroidism   Leukocytosis   Thrombocytosis  Severe malnutrition    Time spent: 25 minutes.     [facility] Pager [phone]. If 7PM-7AM, please contact night-coverage at [email or web address], password [facility] 11/28/2013, 4:28 PM  LOS: 6 days

## 2013-11-28 NOTE — Progress Notes (Signed)
CSW assisting with d/c planning. PN reviewed. KP is planned for [facility]. 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 CSW will meet with pt's daughter following procedure to continue with d/c planning.  Werner Lean LCSW [phone]

## 2013-11-29 ENCOUNTER — Ambulatory Visit (HOSPITAL_COMMUNITY)
Admission: RE | Admit: 2013-11-29 | Payer: Medicare Other | Source: Ambulatory Visit | Admitting: Interventional Radiology

## 2013-11-29 ENCOUNTER — Inpatient Hospital Stay (HOSPITAL_COMMUNITY): Payer: Medicare Other | Admitting: Anesthesiology

## 2013-11-29 ENCOUNTER — Encounter (HOSPITAL_COMMUNITY)
Admission: EM | Disposition: A | Discharge status: Hospice | Payer: Self-pay | Source: Home / Self Care | Attending: Internal Medicine

## 2013-11-29 ENCOUNTER — Encounter (HOSPITAL_COMMUNITY): Payer: Medicare Other | Admitting: Anesthesiology

## 2013-11-29 ENCOUNTER — Ambulatory Visit (HOSPITAL_COMMUNITY): Admit: 2013-11-29 | Payer: Medicare Other

## 2013-11-29 DIAGNOSIS — F29 Unspecified psychosis not due to a substance or known physiological condition: Secondary | ICD-10-CM

## 2013-11-29 DIAGNOSIS — R52 Pain, unspecified: Secondary | ICD-10-CM

## 2013-11-29 DIAGNOSIS — R030 Elevated blood-pressure reading, without diagnosis of hypertension: Secondary | ICD-10-CM

## 2013-11-29 LAB — CBC
HEMATOCRIT: 35.2 % — AB (ref 36.0–46.0)
Hemoglobin: 11.4 g/dL — ABNORMAL LOW (ref 12.0–15.0)
MCH: 28.2 pg (ref 26.0–34.0)
MCHC: 32.4 g/dL (ref 30.0–36.0)
MCV: 87.1 fL (ref 78.0–100.0)
Platelets: 475 10*3/uL — ABNORMAL HIGH (ref 150–400)
RBC: 4.04 MIL/uL (ref 3.87–5.11)
RDW: 14.4 % (ref 11.5–15.5)
WBC: 15.6 10*3/uL — AB (ref 4.0–10.5)

## 2013-11-29 LAB — COMPREHENSIVE METABOLIC PANEL
ALK PHOS: 133 U/L — AB (ref 39–117)
ALT: 12 U/L (ref 0–35)
AST: 16 U/L (ref 0–37)
Albumin: 2.8 g/dL — ABNORMAL LOW (ref 3.5–5.2)
BILIRUBIN TOTAL: 0.3 mg/dL (ref 0.3–1.2)
BUN: 23 mg/dL (ref 6–23)
CHLORIDE: 102 meq/L (ref 96–112)
CO2: 25 mEq/L (ref 19–32)
Calcium: 8.6 mg/dL (ref 8.4–10.5)
Creatinine, Ser: 0.74 mg/dL (ref 0.50–1.10)
GFR calc non Af Amer: 80 mL/min — ABNORMAL LOW (ref >90)
GLUCOSE: 103 mg/dL — AB (ref 70–99)
POTASSIUM: 4 meq/L (ref 3.7–5.3)
Sodium: 137 mEq/L (ref 137–147)
Total Protein: 7 g/dL (ref 6.0–8.3)

## 2013-11-29 SURGERY — RADIOLOGY WITH ANESTHESIA
Anesthesia: General

## 2013-11-29 MED ORDER — SODIUM CHLORIDE 0.9 % IV SOLN
40.0000 mg | Freq: Two times a day (BID) | INTRAVENOUS | Status: DC
  Administered 2013-11-29 – 2013-12-01 (×4): 40 mg via INTRAVENOUS
  Filled 2013-11-29 (×6): qty 4

## 2013-11-29 MED ORDER — LACTATED RINGERS IV SOLN
INTRAVENOUS | Status: DC
Start: 2013-11-29 — End: 2013-11-29
  Administered 2013-11-29: 07:00:00 via INTRAVENOUS

## 2013-11-29 MED ORDER — ENSURE COMPLETE PO LIQD
237.0000 mL | Freq: Three times a day (TID) | ORAL | Status: DC
  Administered 2013-11-30 – 2013-12-02 (×3): 237 mL via ORAL

## 2013-11-29 MED ORDER — CLONIDINE HCL 0.1 MG PO TABS
0.1000 mg | ORAL_TABLET | Freq: Two times a day (BID) | ORAL | Status: DC
  Administered 2013-11-29 – 2013-12-03 (×9): 0.1 mg via ORAL
  Filled 2013-11-29 (×10): qty 1

## 2013-11-29 MED ORDER — KETOROLAC TROMETHAMINE 15 MG/ML IJ SOLN
15.0000 mg | Freq: Three times a day (TID) | INTRAMUSCULAR | Status: DC
  Administered 2013-11-29 – 2013-12-01 (×5): 15 mg via INTRAVENOUS
  Filled 2013-11-29 (×4): qty 1

## 2013-11-29 MED ORDER — HYDRALAZINE HCL 20 MG/ML IJ SOLN
10.0000 mg | Freq: Four times a day (QID) | INTRAMUSCULAR | Status: DC | PRN
  Administered 2013-12-03: 10 mg via INTRAVENOUS
  Filled 2013-11-29: qty 0.5

## 2013-11-29 MED ORDER — LISINOPRIL 40 MG PO TABS
40.0000 mg | ORAL_TABLET | Freq: Every day | ORAL | Status: DC
  Administered 2013-11-30 – 2013-12-03 (×4): 40 mg via ORAL
  Filled 2013-11-29 (×4): qty 1

## 2013-11-29 NOTE — Progress Notes (Signed)
She is going to come hospital now. She'll be going over for her kyphoplasty  And RFA. Hopefully, this will well.  She is very emotional this morning. There may be some element of confusion. Hopefully, with he kyphoplasty we will be able to minimize how much pain medicine she needs. I suspect that she just is sensitive to pain medication.  Her blood pressure has been quite high.  I very much appreciate how much the hospitalist are helping out with her care.  As far as her discharge, we really need to see how well she recovers from the kyphoplasty. We will need to see how her pain is. We will need to see how she does with physical therapy.  Her labs looked okay. Blood pressure is 220/74.  Overall, I still want to make sure that her quality of life is the primary goal.  I will take care of getting radiation oncology involved. We will not do anything until next week anyway. I suspect that she will need radiation to the right hip for this fracture.  Lum Keas Lamentations 3:23

## 2013-11-29 NOTE — Progress Notes (Signed)
Patient has nictoderm patch in center of back and heels of both feet are VERY red.

## 2013-11-29 NOTE — Anesthesia Preprocedure Evaluation (Deleted)
Anesthesia Evaluation  Patient identified by MRN, date of birth, ID band Patient awake    Reviewed: Allergy & Precautions, H&P , NPO status , Patient's Chart, lab work & pertinent test results, reviewed documented beta blocker date and time   Airway Mallampati: II TM Distance: >3 FB Neck ROM: Full    Dental no notable dental hx. (+) Edentulous Upper, Edentulous Lower, Dental Advisory Given   Pulmonary Current Smoker,  breath sounds clear to auscultation  Pulmonary exam normal       Cardiovascular hypertension, On Medications and On Home Beta Blockers + Valvular Problems/Murmurs AS Rhythm:Regular Rate:Normal + Systolic murmurs    Neuro/Psych negative neurological ROS  negative psych ROS   GI/Hepatic negative GI ROS, Neg liver ROS,   Endo/Other  Hypothyroidism   Renal/GU negative Renal ROS  negative genitourinary   Musculoskeletal   Abdominal   Peds  Hematology negative hematology ROS (+)   Anesthesia Other Findings   Reproductive/Obstetrics negative OB ROS                          Anesthesia Physical Anesthesia Plan  ASA: IV  Anesthesia Plan: General   Post-op Pain Management:    Induction: Intravenous  Airway Management Planned: Oral ETT  Additional Equipment: Arterial line  Intra-op Plan:   Post-operative Plan: Extubation in OR  Informed Consent: I have reviewed the patients History and Physical, chart, labs and discussed the procedure including the risks, benefits and alternatives for the proposed anesthesia with the patient or authorized representative who has indicated his/her understanding and acceptance.   Dental advisory given  Plan Discussed with: CRNA  Anesthesia Plan Comments: (Case cancelled by Dr. Estanislado Pandy)       Anesthesia Quick Evaluation

## 2013-11-29 NOTE — Progress Notes (Signed)
CSW met with pt's daughter following her return from [facility]. Pt declined kyphoplasty. Daughter was hoping pt would have this procedure and pain would be reduced. Daughter wanted pt to d/c home with her, but no longer feels this will be possible. She is questioning if [facility] may be a possibility. Daughter will discuss this with Dr. Marin Olp. CSW will continue to follow to assist with d/c planning needs.  Werner Lean LCSW [phone]
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
## 2013-11-29 NOTE — Progress Notes (Signed)
[facility] PROGRESS NOTE  TAWNEY VANORMAN EGB:[id number] DOB: 10/07/1934 DOA: 11/22/2013 PCP: Gavin Pound, MD Interim summary: Nichole Werner is a 79 y.o. female with recent diagnosis of Metastatic Lung Cancer who was sent for a Pet Scan this Afternoon which returned revealing diffuse metastatic Disease and a RUL Lung Tumor, as well as a Pathologic Fracture of the Right Hip. She had been having increased low back pain for several weeks and was sent for an MRI by Dr. Nelva Bush who interpreted a neoplastic process of the L4 Compression fracture which was found. She report having right hip pain for the past 4 days and she was sent for an PET scan today by Dr. Marin Olp, the results of which revealed a pathologic fracture of the right hip. She was referred tot he ED. She denies any history of trauma or fall. She denies having fevers chills or night sweats, she does report having a loss of appetite over the past few months and an unintentional weight loss of 20 pounds over the past 6 months. She was admitted to medical service for further management. MRI of the thoracic , lumbar and right hip ordered. Results of the right hip discussed the patient's daughter at bedside. MRI of the lumbar spine showed L4 pathological compression fracture and moderate stenosis,. MRI of the thoracic spine showed  Pathological fracture of the T3 and T 11. IR consult put in to see if she could get kyphoplasty. MRI of the right hip showed several areas of metastatic disease in the bones and surgery is not indicated. Recommendations are for pain control and bedrest for now. Called Dr Marin Olp office on 5/19, he is out of office today. Plan to call him on 5/20 and see if we need to consult radiation oncology for initiating radiation treatments. Currently patient is confused with bouts of agitation, pulling out IV lines and foley catheter. She was started on haldol with some relief.     Assessment/Plan:  Pathological fracture of the  right hip: - secondary to metastatic disease. Orthopedics following - pain control and conservative management as per discussions.  - MRI of the right hip done and showed metastatic disease.  - no surgical plans per Ortho - Dr.Ennever to discuss Palliative option with family  Widely Metastatic lung cancer; - Dr Marin Olp following and appreciate his recommendations.  - she doesn't want any aggressive measures at this time.  - DNR now - Dr.Ennever to discuss residential Hospice with pt and family  L4 /T11 pathological compression Fx, Sacral Fx -Multiple thoracic compression fx noted on MRI -IR was following planning kyphoplasty of L4/T11 lesion, sacroplasty today, she declined this at [facility] this am -unclear if she will be able to comply with XRT   HTN  -BP much higher this am -add CLonidine, increase lisinopril, continue Toprol, hydralazine prn  Hypothyroid-TSH within normal limits. and continue Levothyroxine,.   Leukocytosis- Reactive, malignancy -urine Cx negative, stopped ceftriaxone  Tobacco Use Disorder- Nicotine Patch daily.   DVT prophylaxis: lovenox  Code Status: full code Family Communication:d/w son at bedside.  Disposition Plan: will likely need Residential Hospice   Consultants:  Ortho  oncology  Procedures:  MRI T/L spine.   Antibiotics:  Rocephin for UTI. Stopped 5/18/   HPI/Subjective: Declined all IR procedures after she reached [facility]  Objective: Filed Vitals:   11/29/13 1418  BP: 162/104  Pulse: 89  Temp: 98.3 F (36.8 C)  Resp: 16    Intake/Output Summary (Last 24 hours) at 11/29/13 1519 Last  data filed at 11/29/13 1438  Gross per 24 hour  Intake    240 ml  Output    725 ml  Net   -485 ml   Filed Weights   11/23/13 0000 11/29/13 0702  Weight: 67.132 kg (148 lb) 67.132 kg (148 lb)    Exam:   General: alert and resting.   Cardiovascular: s1s2  Respiratory: ctab  Abdomen: soft NT ND BS+  Musculoskeletal: painful ROM of  the LE, no pedal edema.   Data Reviewed: Basic Metabolic Panel:  Recent Labs Lab 11/23/13 0522 11/25/13 0435 11/26/13 0421 11/27/13 0426 11/29/13 0427  NA 136* 139 138 139 137  K 4.5 3.5* 4.0 3.9 4.0  CL 99 100 102 103 102  CO2 27 24 22 20 25   GLUCOSE 97 107* 104* 97 103*  BUN 16 10 11 17 23   CREATININE 0.85 0.74 0.72 0.61 0.74  CALCIUM 8.8 8.5 8.3* 8.5 8.6   Liver Function Tests:  Recent Labs Lab 11/22/13 2045 11/25/13 0435 11/29/13 0427  AST 15 15 16   ALT 10 10 12   ALKPHOS 106 112 133*  BILITOT 0.4 0.3 0.3  PROT 7.5 6.7 7.0  ALBUMIN 3.2* 2.6* 2.8*   No results found for this basename: LIPASE, AMYLASE,  in the last 168 hours No results found for this basename: AMMONIA,  in the last 168 hours CBC:  Recent Labs Lab 11/22/13 2045 11/23/13 0522 11/25/13 0435 11/27/13 0426 11/29/13 0427  WBC 16.1* 15.6* 14.6* 16.0* 15.6*  NEUTROABS 11.5*  --   --   --   --   HGB 11.1* 10.5* 11.1* 11.5* 11.4*  HCT 33.5* 32.8* 33.4* 34.8* 35.2*  MCV 86.1 86.8 85.4 86.4 87.1  PLT 416* 364 410* 455* 475*   Cardiac Enzymes: No results found for this basename: CKTOTAL, CKMB, CKMBINDEX, TROPONINI,  in the last 168 hours BNP (last 3 results) No results found for this basename: PROBNP,  in the last 8760 hours CBG: No results found for this basename: GLUCAP,  in the last 168 hours  Recent Results (from the past 240 hour(s))  URINE CULTURE     Status: None   Collection Time    11/23/13  8:11 PM      Result Value Ref Range Status   Specimen Description URINE, CATHETERIZED   Final   Special Requests Normal   Final   Culture  Setup Time     Final   Value: 11/24/2013 03:35     Performed at [place]     Final   Value: NO GROWTH     Performed at [facility]   Culture     Final   Value: NO GROWTH     Performed at [facility]   Report Status 11/25/2013 FINAL   Final     Studies: No results found.  Scheduled Meds: . beta carotene  w/minerals  1 tablet Oral Daily  . cloNIDine  0.1 mg Oral BID  . enoxaparin (LOVENOX) injection  40 mg Subcutaneous QHS  . feeding supplement (ENSURE COMPLETE)  237 mL Oral TID BM  . fentaNYL  12.5 mcg Transdermal Q72H  . levothyroxine  50 mcg Oral QAC breakfast  . [START ON 11/30/2013] lisinopril  40 mg Oral Daily  . metoprolol succinate  100 mg Oral Daily  . nicotine  14 mg Transdermal Daily   Continuous Infusions:    Principal Problem:   Pathologic fracture Active Problems:   Mixed hyperlipidemia  Tobacco use disorder   Essential hypertension, benign   Metastatic lung cancer (metastasis from lung to other site)   Compression fracture of L4 lumbar vertebra   UTI (lower urinary tract infection)   Hypothyroidism   Leukocytosis   Thrombocytosis   Severe malnutrition    Time spent: 25 minutes.     [facility] Pager [phone]. If 7PM-7AM, please contact night-coverage at [email or web address], password [facility] 11/29/2013, 3:19 PM  LOS: 7 days

## 2013-11-30 DIAGNOSIS — Z5320 Procedure and treatment not carried out because of patient's decision for unspecified reasons: Secondary | ICD-10-CM

## 2013-11-30 DIAGNOSIS — IMO0002 Reserved for concepts with insufficient information to code with codable children: Secondary | ICD-10-CM

## 2013-11-30 NOTE — Progress Notes (Signed)
Mrs. Howze is more alert this morning. Her narcotic medications have been discontinued. She appears comfortable. She is able to answer questions fairly appropriately.  We tried to do the kyphoplasty and radiofrequency procedure yesterday. She refused. Again a lot of a refusal was confusion on her part. However, I certainly understand interventional radiology want to hold on the procedure.  We have her on nonsteroidals now. We just have to be very careful with gastrointestinal side effects.  She has not gotten out of bed., I am not sure if she will be able to get out of bed.  We really have to see how she does over the weekend with respect to her mental state.  She still insists on having that done right now. She is not able to go back to the assisted living facility. She is not able to be cared for at home.  Her blood pressure is a lot better. It is 1 6478. Temperature 98 degrees. Pulse is 87. Her lungs sound pretty clear bilaterally. Cardiac exam regular in rhythm. Abdomen soft. There is no tenderness. Extremities shows muscle atrophy in upper and lower extremities. Skin exam without rashes. Neurological exam is non-focal.  I really think that the next 2 days or so will tell us how she will improve. I still would like to try and consider the kyphoplasty procedure. I just really think this can help with her pain long-term. He was she cannot get radiation, I still think this would help.  I will talk to her family today.  I think that she probably would be a candidate for [facility] when we are ready to discharge her.  I very much appreciate the great care that she is getting from the hospitalist and from the staff on 6 E.!!  Pete E.  [place] 4:6-7

## 2013-11-30 NOTE — Progress Notes (Signed)
[facility] PROGRESS NOTE  MAUREEN DELATTE IDP:[id number] DOB: 06-24-1935 DOA: 11/22/2013 PCP: Gavin Pound, MD Interim summary: Nichole Werner is a 79 y.o. female with recent diagnosis of Metastatic Lung Cancer who was sent for a Pet Scan this Afternoon which returned revealing diffuse metastatic Disease and a RUL Lung Tumor, as well as a Pathologic Fracture of the Right Hip. She had been having increased low back pain for several weeks and was sent for an MRI by Dr. Nelva Bush who interpreted a neoplastic process of the L4 Compression fracture which was found. She report having right hip pain for the past 4 days and she was sent for an PET scan today by Dr. Marin Olp, the results of which revealed a pathologic fracture of the right hip. She was referred tot he ED. She denies any history of trauma or fall. She denies having fevers chills or night sweats, she does report having a loss of appetite over the past few months and an unintentional weight loss of 20 pounds over the past 6 months. She was admitted to medical service for further management. MRI of the thoracic , lumbar and right hip ordered. Results of the right hip discussed the patient's daughter at bedside. MRI of the lumbar spine showed L4 pathological compression fracture and moderate stenosis,. MRI of the thoracic spine showed  Pathological fracture of the T3 and T 11. IR consult put in to see if she could get kyphoplasty. MRI of the right hip showed several areas of metastatic disease in the bones and surgery is not indicated. Recommendations are for pain control and bedrest for now. Called Dr Marin Olp office on 5/19, he is out of office today. Plan to call him on 5/20 and see if we need to consult radiation oncology for initiating radiation treatments. Currently patient is confused with bouts of agitation, pulling out IV lines and foley catheter. She was started on haldol with some relief.     Assessment/Plan:  Pathological fracture of the  right hip/ L4 /T11 pathological compression Fx, Sacral Fx -IR was following,  planned kyphoplasty of L4/T11 lesion, sacroplasty 5/22, but  she declined this at [facility]  -I doubt she will comply with XRT -Since she doesn't have much pain at this time, i think she may not need those procedures anyway -Dr. Marin Olp to discuss Residential Hospice with Family -she has declined PT for 5 days in a row, hasnt gotten out of bed yet -defer disposition to Dr.Ennever  Widely Metastatic lung cancer; - Dr Marin Olp following   - she doesn't want any aggressive measures at this time.  - DNR, seems appropriate for residential Hospice    HTN  -BP improved -continue CLonidine, increase lisinopril, continue Toprol, hydralazine prn  Hypothyroid-TSH within normal limits. and continue Levothyroxine,.   Leukocytosis- Reactive, malignancy -urine Cx negative, stopped ceftriaxone  Tobacco Use Disorder- Nicotine Patch daily.   DVT prophylaxis: lovenox  Code Status: full code Family Communication:d/w son at bedside 5/22 Disposition Plan: will likely need Residential Hospice, per Dr.Ennever   Consultants:  Ortho  oncology  Procedures:  MRI T/L spine.   Antibiotics:  Rocephin for UTI. Stopped 5/18/   HPI/Subjective: Feels ok, " I just want to sleep all day" Denies much pain at all  Objective: Filed Vitals:   11/30/13 0618  BP: 164/78  Pulse: 87  Temp: 98 F (36.7 C)  Resp: 15    Intake/Output Summary (Last 24 hours) at 11/30/13 1217 Last data filed at 11/30/13 4431  Gross per  24 hour  Intake    240 ml  Output    800 ml  Net   -560 ml   Filed Weights   11/23/13 0000 11/29/13 0702  Weight: 67.132 kg (148 lb) 67.132 kg (148 lb)    Exam:   General: alert and resting.   Cardiovascular: s1s2  Respiratory: ctab  Abdomen: soft NT ND BS+  Musculoskeletal: painful ROM of the LE, no pedal edema.   Data Reviewed: Basic Metabolic Panel:  Recent Labs Lab 11/25/13 0435  11/26/13 0421 11/27/13 0426 11/29/13 0427  NA 139 138 139 137  K 3.5* 4.0 3.9 4.0  CL 100 102 103 102  CO2 24 22 20 25   GLUCOSE 107* 104* 97 103*  BUN 10 11 17 23   CREATININE 0.74 0.72 0.61 0.74  CALCIUM 8.5 8.3* 8.5 8.6   Liver Function Tests:  Recent Labs Lab 11/25/13 0435 11/29/13 0427  AST 15 16  ALT 10 12  ALKPHOS 112 133*  BILITOT 0.3 0.3  PROT 6.7 7.0  ALBUMIN 2.6* 2.8*   No results found for this basename: LIPASE, AMYLASE,  in the last 168 hours No results found for this basename: AMMONIA,  in the last 168 hours CBC:  Recent Labs Lab 11/25/13 0435 11/27/13 0426 11/29/13 0427  WBC 14.6* 16.0* 15.6*  HGB 11.1* 11.5* 11.4*  HCT 33.4* 34.8* 35.2*  MCV 85.4 86.4 87.1  PLT 410* 455* 475*   Cardiac Enzymes: No results found for this basename: CKTOTAL, CKMB, CKMBINDEX, TROPONINI,  in the last 168 hours BNP (last 3 results) No results found for this basename: PROBNP,  in the last 8760 hours CBG: No results found for this basename: GLUCAP,  in the last 168 hours  Recent Results (from the past 240 hour(s))  URINE CULTURE     Status: None   Collection Time    11/23/13  8:11 PM      Result Value Ref Range Status   Specimen Description URINE, CATHETERIZED   Final   Special Requests Normal   Final   Culture  Setup Time     Final   Value: 11/24/2013 03:35     Performed at [place]     Final   Value: NO GROWTH     Performed at [facility]   Culture     Final   Value: NO GROWTH     Performed at [facility]   Report Status 11/25/2013 FINAL   Final     Studies: No results found.  Scheduled Meds: . beta carotene w/minerals  1 tablet Oral Daily  . cloNIDine  0.1 mg Oral BID  . enoxaparin (LOVENOX) injection  40 mg Subcutaneous QHS  . famotidine (PEPCID) IV  40 mg Intravenous Q12H  . feeding supplement (ENSURE COMPLETE)  237 mL Oral TID BM  . ketorolac  15 mg Intravenous 3 times per day  . levothyroxine  50 mcg  Oral QAC breakfast  . lisinopril  40 mg Oral Daily  . metoprolol succinate  100 mg Oral Daily  . nicotine  14 mg Transdermal Daily   Continuous Infusions:    Principal Problem:   Pathologic fracture Active Problems:   Mixed hyperlipidemia   Tobacco use disorder   Essential hypertension, benign   Metastatic lung cancer (metastasis from lung to other site)   Compression fracture of L4 lumbar vertebra   UTI (lower urinary tract infection)   Hypothyroidism   Leukocytosis   Thrombocytosis  Severe malnutrition    Time spent: 25 minutes.     [facility] [facility] Pager [phone]. If 7PM-7AM, please contact night-coverage at [email or web address], password [facility] 11/30/2013, 12:17 PM  LOS: 8 days

## 2013-11-30 NOTE — Progress Notes (Signed)
OT Cancellation Note  Patient Details Name: Nichole Werner MRN: [id number] DOB: 08/09/1934   Cancelled Treatment:     Noted pt still on bedrest.  OT will sign off. Please reorder if OT is needed. Thanks, Betsy Pries 11/30/2013, 12:03 PM

## 2013-11-30 NOTE — Progress Notes (Signed)
PT Cancellation Note  Patient Details Name: Nichole Werner MRN: [id number] DOB: 1935/07/11   Cancelled Treatment:    Reason Eval/Treat Not Completed: Medical issues which prohibited therapy      This pt has been cancelled for for 5 DAYS due to medical issues, confusion/agitation, and being on bedrest until  kyphoplasty (which she refused); pt is still currently on bedrest and per oncology notes may be a candidate for [facility]; PT will sign off at this time; If PT needed please reorder, update activity status and precautions.    Thank you.   Neil Crouch 11/30/2013, 11:40 AM

## 2013-12-01 DIAGNOSIS — IMO0002 Reserved for concepts with insufficient information to code with codable children: Secondary | ICD-10-CM

## 2013-12-01 LAB — CBC
HCT: 31.5 % — ABNORMAL LOW (ref 36.0–46.0)
Hemoglobin: 10.3 g/dL — ABNORMAL LOW (ref 12.0–15.0)
MCH: 28.3 pg (ref 26.0–34.0)
MCHC: 32.7 g/dL (ref 30.0–36.0)
MCV: 86.5 fL (ref 78.0–100.0)
PLATELETS: 424 10*3/uL — AB (ref 150–400)
RBC: 3.64 MIL/uL — ABNORMAL LOW (ref 3.87–5.11)
RDW: 14.3 % (ref 11.5–15.5)
WBC: 13.5 10*3/uL — AB (ref 4.0–10.5)

## 2013-12-01 LAB — COMPREHENSIVE METABOLIC PANEL
ALBUMIN: 2.5 g/dL — AB (ref 3.5–5.2)
ALT: 11 U/L (ref 0–35)
AST: 12 U/L (ref 0–37)
Alkaline Phosphatase: 116 U/L (ref 39–117)
BILIRUBIN TOTAL: 0.3 mg/dL (ref 0.3–1.2)
BUN: 25 mg/dL — ABNORMAL HIGH (ref 6–23)
CHLORIDE: 104 meq/L (ref 96–112)
CO2: 26 mEq/L (ref 19–32)
CREATININE: 0.86 mg/dL (ref 0.50–1.10)
Calcium: 8.2 mg/dL — ABNORMAL LOW (ref 8.4–10.5)
GFR calc Af Amer: 74 mL/min — ABNORMAL LOW (ref >90)
GFR, EST NON AFRICAN AMERICAN: 64 mL/min — AB (ref >90)
Glucose, Bld: 106 mg/dL — ABNORMAL HIGH (ref 70–99)
Potassium: 3.9 mEq/L (ref 3.7–5.3)
SODIUM: 139 meq/L (ref 137–147)
Total Protein: 6.1 g/dL (ref 6.0–8.3)

## 2013-12-01 LAB — URINE CULTURE
Colony Count: NO GROWTH
Culture: NO GROWTH

## 2013-12-01 LAB — PREALBUMIN: Prealbumin: 10.6 mg/dL — ABNORMAL LOW (ref 17.0–34.0)

## 2013-12-01 MED ORDER — IBUPROFEN 600 MG PO TABS
600.0000 mg | ORAL_TABLET | Freq: Once | ORAL | Status: AC
  Administered 2013-12-01: 600 mg via ORAL
  Filled 2013-12-01: qty 1

## 2013-12-01 MED ORDER — KETOROLAC TROMETHAMINE 15 MG/ML IJ SOLN
15.0000 mg | Freq: Three times a day (TID) | INTRAMUSCULAR | Status: AC
  Administered 2013-12-01 – 2013-12-03 (×6): 15 mg via INTRAVENOUS
  Filled 2013-12-01 (×6): qty 1

## 2013-12-01 MED ORDER — SODIUM CHLORIDE 0.9 % IV SOLN
INTRAVENOUS | Status: DC
  Administered 2013-12-01: 18:00:00 via INTRAVENOUS

## 2013-12-01 MED ORDER — SODIUM CHLORIDE 0.9 % IV SOLN
1.5000 g | Freq: Three times a day (TID) | INTRAVENOUS | Status: DC
  Administered 2013-12-01 – 2013-12-03 (×6): 1.5 g via INTRAVENOUS
  Filled 2013-12-01 (×7): qty 1.5

## 2013-12-01 MED ORDER — TRAZODONE HCL 50 MG PO TABS
50.0000 mg | ORAL_TABLET | Freq: Every evening | ORAL | Status: AC | PRN
  Administered 2013-12-01: 50 mg via ORAL
  Filled 2013-12-01: qty 1

## 2013-12-01 MED ORDER — PANTOPRAZOLE SODIUM 40 MG PO TBEC
40.0000 mg | DELAYED_RELEASE_TABLET | Freq: Every day | ORAL | Status: DC
  Administered 2013-12-02 – 2013-12-03 (×2): 40 mg via ORAL
  Filled 2013-12-01 (×2): qty 1

## 2013-12-01 NOTE — Progress Notes (Signed)
Nortified MD about pt's cellulitis on her right arm. Received order for cold compress. In the meantime, Dr. Dan Humphreys ordered IV antibiotics.

## 2013-12-01 NOTE — Progress Notes (Signed)
Transferred to 1526. [employer]

## 2013-12-01 NOTE — Progress Notes (Signed)
[facility] PROGRESS NOTE  Nichole Werner WUG:[id number] DOB: Sep 29, 1934 DOA: 11/22/2013 PCP: Gavin Pound, MD Interim summary: Nichole Werner is a 79 y.o. female with recent diagnosis of Metastatic Lung Cancer who was sent for a Pet Scan this Afternoon which returned revealing diffuse metastatic Disease and a RUL Lung Tumor, as well as a Pathologic Fracture of the Right Hip. She had been having increased low back pain for several weeks and was sent for an MRI by Dr. Nelva Bush who interpreted a neoplastic process of the L4 Compression fracture which was found. She report having right hip pain for the past 4 days and she was sent for an PET scan today by Dr. Marin Olp, the results of which revealed a pathologic fracture of the right hip. She was referred tot he ED. She denies any history of trauma or fall. She denies having fevers chills or night sweats, she does report having a loss of appetite over the past few months and an unintentional weight loss of 20 pounds over the past 6 months. She was admitted to medical service for further management. MRI of the thoracic , lumbar and right hip ordered. Results of the right hip discussed the patient's daughter at bedside. MRI of the lumbar spine showed L4 pathological compression fracture and moderate stenosis,. MRI of the thoracic spine showed  Pathological fracture of the T3 and T 11. IR consult put in to see if she could get kyphoplasty. MRI of the right hip showed several areas of metastatic disease in the bones and surgery is not indicated. Recommendations are for pain control and bedrest for now. Called Dr Marin Olp office on 5/19, he is out of office today. Plan to call him on 5/20 and see if we need to consult radiation oncology for initiating radiation treatments. Currently patient is confused with bouts of agitation, pulling out IV lines and foley catheter. She was started on haldol with some relief.     Assessment/Plan:  Pathological fracture of the  right hip/ L4 /T11 pathological compression Fx, Sacral Fx -IR was following,  planned kyphoplasty of L4/T11 lesion, sacroplasty 5/22, but  she declined this at [facility]  -I doubt she will comply with XRT -Since she doesn't have much pain at this time, i think she may not need those procedures anyway -Dr. Marin Olp to discuss Residential Hospice with Family -she has declined PT for 5 days in a row, hasnt gotten out of bed yet -Ortho Dr.Alusio recommended WBAT RLE -disposition to Residential Hospice per Dr.Ennever  Widely Metastatic lung cancer; - Dr Marin Olp following   - she doesn't want any aggressive measures at this time.  - DNR, seems appropriate for residential Hospice   Thrombophlebitis -IV site, IV out -cold compresses -Started on Abx per dr.Ennever   HTN  -BP improved -continue CLonidine, increase lisinopril, continue Toprol, hydralazine prn  Hypothyroid-TSH within normal limits. and continue Levothyroxine,.   Leukocytosis- Reactive, malignancy -urine Cx negative, stopped ceftriaxone  Tobacco Use Disorder- Nicotine Patch daily.   DVT prophylaxis: lovenox  Code Status: full code Family Communication:d/w family at bedside 5/24 Disposition Plan: Residential Hospice, per Dr.Ennever   Consultants:  Ortho  oncology  Procedures:  MRI T/L spine.   Antibiotics:  Rocephin for UTI. Stopped 5/18/   HPI/Subjective: Feels ok, " I just want to sleep all day" Denies much pain at all  Objective: Filed Vitals:   12/01/13 0929  BP: 112/62  Pulse: 68  Temp: 98.2 F (36.8 C)  Resp: 18  Intake/Output Summary (Last 24 hours) at 12/01/13 1134 Last data filed at 12/01/13 0859  Gross per 24 hour  Intake    414 ml  Output    452 ml  Net    -38 ml   Filed Weights   11/23/13 0000 11/29/13 0702  Weight: 67.132 kg (148 lb) 67.132 kg (148 lb)    Exam:   General: alert and resting.   Cardiovascular: s1s2  Respiratory: ctab  Abdomen: soft NT ND  BS+  Musculoskeletal: painful ROM of the LE, no pedal edema.   R arm with red streaky area, non tender  Data Reviewed: Basic Metabolic Panel:  Recent Labs Lab 11/25/13 0435 11/26/13 0421 11/27/13 0426 11/29/13 0427 12/01/13 0425  NA 139 138 139 137 139  K 3.5* 4.0 3.9 4.0 3.9  CL 100 102 103 102 104  CO2 24 22 20 25 26   GLUCOSE 107* 104* 97 103* 106*  BUN 10 11 17 23  25*  CREATININE 0.74 0.72 0.61 0.74 0.86  CALCIUM 8.5 8.3* 8.5 8.6 8.2*   Liver Function Tests:  Recent Labs Lab 11/25/13 0435 11/29/13 0427 12/01/13 0425  AST 15 16 12   ALT 10 12 11   ALKPHOS 112 133* 116  BILITOT 0.3 0.3 0.3  PROT 6.7 7.0 6.1  ALBUMIN 2.6* 2.8* 2.5*   No results found for this basename: LIPASE, AMYLASE,  in the last 168 hours No results found for this basename: AMMONIA,  in the last 168 hours CBC:  Recent Labs Lab 11/25/13 0435 11/27/13 0426 11/29/13 0427 12/01/13 0425  WBC 14.6* 16.0* 15.6* 13.5*  HGB 11.1* 11.5* 11.4* 10.3*  HCT 33.4* 34.8* 35.2* 31.5*  MCV 85.4 86.4 87.1 86.5  PLT 410* 455* 475* 424*   Cardiac Enzymes: No results found for this basename: CKTOTAL, CKMB, CKMBINDEX, TROPONINI,  in the last 168 hours BNP (last 3 results) No results found for this basename: PROBNP,  in the last 8760 hours CBG: No results found for this basename: GLUCAP,  in the last 168 hours  Recent Results (from the past 240 hour(s))  URINE CULTURE     Status: None   Collection Time    11/23/13  8:11 PM      Result Value Ref Range Status   Specimen Description URINE, CATHETERIZED   Final   Special Requests Normal   Final   Culture  Setup Time     Final   Value: 11/24/2013 03:35     Performed at [place]     Final   Value: NO GROWTH     Performed at [facility]   Culture     Final   Value: NO GROWTH     Performed at [facility]   Report Status 11/25/2013 FINAL   Final     Studies: No results found.  Scheduled Meds: .  ampicillin-sulbactam (UNASYN) IV  1.5 g Intravenous 3 times per day  . beta carotene w/minerals  1 tablet Oral Daily  . cloNIDine  0.1 mg Oral BID  . enoxaparin (LOVENOX) injection  40 mg Subcutaneous QHS  . famotidine (PEPCID) IV  40 mg Intravenous Q12H  . feeding supplement (ENSURE COMPLETE)  237 mL Oral TID BM  . ketorolac  15 mg Intravenous 3 times per day  . levothyroxine  50 mcg Oral QAC breakfast  . lisinopril  40 mg Oral Daily  . metoprolol succinate  100 mg Oral Daily  . nicotine  14 mg Transdermal  Daily   Continuous Infusions:    Principal Problem:   Pathologic fracture Active Problems:   Mixed hyperlipidemia   Tobacco use disorder   Essential hypertension, benign   Metastatic lung cancer (metastasis from lung to other site)   Compression fracture of L4 lumbar vertebra   UTI (lower urinary tract infection)   Hypothyroidism   Leukocytosis   Thrombocytosis   Severe malnutrition    Time spent: 25 minutes.     [place] [facility] Pager [phone]. If 7PM-7AM, please contact night-coverage at [email or web address], password [facility] 12/01/2013, 11:34 AM  LOS: 9 days

## 2013-12-01 NOTE — Progress Notes (Signed)
Per chart review, patient recommended for residential hospice. Per chart review, pt family requesting [facility]. CSW called and left message with [facility] liaison regarding referral.   CSW spoke with pt daughter regarding disposition reocmmendation by Dr. Marcene Duos. Pt daughter, Nichole Werner [phone] states that she prefers to only look at [facility] as recommended by Dr. Marcene Duos at this time. CSW and pt daughter discussed the possibility of needing to consider other options if [facility] does not have availability, and pt daughter verbalized understanding however not willing to consider other residential hospice homes at this time.   Pt daughter plans to f/u with [facility].   Belia Heman, LCSW covering [place] 12/01/2013 1422pm

## 2013-12-01 NOTE — Progress Notes (Signed)
PT Cancellation Note  Patient Details Name: Nichole Werner MRN: [id number] DOB: January 15, 1935   Cancelled Treatment:    Reason Eval/Treat Not Completed: Other (comment)  Re-order noted -- Pt still on bedrest--RN notified MD but no update on precautions, activity; Plan appears to be for residential Hospice--PT will sign off at this time;    Neil Crouch 12/01/2013, 1:16 PM

## 2013-12-01 NOTE — Progress Notes (Signed)
Nichole Werner is alert. She is still somewhat confused but pleasant. She has possible cellulitis associated with an IV in her right arm. The IV will be pulled.  She seems comfortable. She doesn't appear to be her hurting. She is on the nonsteroidal program.  She is afebrile. Blood pressure is back up.  Her labs do not look to bad. Her albumin is 2.5. I did repeat her pre-albumin which is pending.  I did get a urine culture on her yesterday. This is also pending.  Currently, she does not have an IV in. I think we have to get some type of IV in her so that we can treat this cellulitis in the right arm. I think that she probably will benefit from a PICC line. We will see about having one placed.  I don't think she will ever agree to a kyphoplasty. I am not sure how "competent" she is. However, I would not want to force a procedure on her.  I know that her family is going to go look at [facility]. I believe this will be the best place for her. Have a PICC line and will also make an better for her at [facility].  I don't see she's eating that much. She is not hungry from what she tells me.  Pete E.  Rodman Key 6:21

## 2013-12-01 NOTE — Progress Notes (Signed)
Pt only putting out 150 cc in foley for shift thus so far. Bladder scanned pt, 0 in bladder. Notified MD, she ordered NS at 12.

## 2013-12-02 DIAGNOSIS — F22 Delusional disorders: Secondary | ICD-10-CM

## 2013-12-02 LAB — BASIC METABOLIC PANEL
BUN: 18 mg/dL (ref 6–23)
CO2: 24 meq/L (ref 19–32)
Calcium: 8.3 mg/dL — ABNORMAL LOW (ref 8.4–10.5)
Chloride: 103 mEq/L (ref 96–112)
Creatinine, Ser: 0.69 mg/dL (ref 0.50–1.10)
GFR calc Af Amer: >90 mL/min (ref >90)
GFR, EST NON AFRICAN AMERICAN: 82 mL/min — AB (ref >90)
GLUCOSE: 116 mg/dL — AB (ref 70–99)
Potassium: 3.7 mEq/L (ref 3.7–5.3)
SODIUM: 138 meq/L (ref 137–147)

## 2013-12-02 MED ORDER — HALOPERIDOL LACTATE 5 MG/ML IJ SOLN
5.0000 mg | Freq: Four times a day (QID) | INTRAMUSCULAR | Status: DC | PRN
  Administered 2013-12-02 (×2): 5 mg via INTRAVENOUS
  Filled 2013-12-02 (×2): qty 1

## 2013-12-02 NOTE — Progress Notes (Signed)
Nichole Werner is clearly more agitated this AM.  She really did not sleep last night.  I think that the Haldol dose probably needs to increase.  She just has a lot of paranoia going on right now. I suppose that she may have CNS metastasis but she certainly would not be a candidate for any type of intervention. She would not incorporate for a CT scan.  She continues to topple off the IVs.  She is on Unasyn for the cellulitis in the right arm from an IV. This does look a little bit better.  Her blood pressure is running a little bit more normal. Is 155/70. Temperature 97.7.  The still nothing focal on her physical exam.  I think she is for these patients who just has a marked sensitivity to medications. She cannot take Valium or that category. I we would just have to continue with the Haldol. We could also use other antipsychotics if necessary.  We will see about getting her over to [facility] I believe this is going to be best for her. Her family has seen [facility] place. They  are  quite happy with this.  I just with there's more that we could do for her. I does feel bad she has this delirium. This could easily be a paraneoplastic effect from the malignancy. Peripheral lab work looked okay. I probably would not try any more labs on her right now.  I will call [place] and see what the bed availability is.  Nichole Werner 3:17

## 2013-12-02 NOTE — Progress Notes (Signed)
[facility] PROGRESS NOTE  [name] OQH:[id number] DOB: 09/27/1934 DOA: 11/22/2013 PCP: Gavin Pound, MD Interim summary: Nichole Werner is a 79 y.o. female with recent diagnosis of Metastatic Lung Cancer who was sent for a Pet Scan this Afternoon which returned revealing diffuse metastatic Disease and a RUL Lung Tumor, as well as a Pathologic Fracture of the Right Hip. She had been having increased low back pain for several weeks and was sent for an MRI by Dr. Nelva Bush who interpreted a neoplastic process of the L4 Compression fracture which was found. She report having right hip pain for the past 4 days and she was sent for an PET scan today by Dr. Marin Olp, the results of which revealed a pathologic fracture of the right hip. She was referred tot he ED. She denies any history of trauma or fall. She denies having fevers chills or night sweats, she does report having a loss of appetite over the past few months and an unintentional weight loss of 20 pounds over the past 6 months. She was admitted to medical service for further management. MRI of the thoracic , lumbar and right hip ordered. Results of the right hip discussed the patient's daughter at bedside. MRI of the lumbar spine showed L4 pathological compression fracture and moderate stenosis,. MRI of the thoracic spine showed  Pathological fracture of the T3 and T 11. IR consult put in to see if she could get kyphoplasty. MRI of the right hip showed several areas of metastatic disease in the bones and surgery is not indicated. Recommendations are for pain control and bedrest for now. Called Dr Marin Olp office on 5/19, he is out of office today. Plan to call him on 5/20 and see if we need to consult radiation oncology for initiating radiation treatments. Currently patient is confused with bouts of agitation, pulling out IV lines and foley catheter. She was started on haldol with some relief.     Assessment/Plan:  Pathological fracture of the  right hip/ L4 /T11 pathological compression Fx, Sacral Fx -IR was following,  planned kyphoplasty of L4/T11 lesion, sacroplasty 5/22, but  she declined this at [facility]  -I doubt she will comply with XRT -Since she doesn't have much pain at this time, i think she may not need those procedures anyway -Now plans for Residential Hospice -Medically stable for discharge once bed available  Delirium/confusion -likely due to meds, prolonged hospitalization - off narcotics now, haldol PRN - cannot rule out brain mets, but no further workup indicated  Widely Metastatic lung cancer; - Dr Marin Olp following   - she doesn't want any aggressive measures at this time.  - DNR, appropriate for residential Hospice   Thrombophlebitis -IV site, IV out -cold compresses -On Abx per dr.Ennever, Day 2 of unasyn   HTN  -BP improved -continue CLonidine, increase lisinopril, continue Toprol, hydralazine prn  Hypothyroid-TSH within normal limits. and continue Levothyroxine,.   Leukocytosis- Reactive, malignancy -urine Cx negative, stopped ceftriaxone  Tobacco Use Disorder- Nicotine Patch daily.   DVT prophylaxis: lovenox  Code Status: full code Family Communication:d/w family at bedside 5/24 Disposition Plan: [facility] when bed available   Consultants:  Ortho  oncology  Procedures:  MRI T/L spine.   Antibiotics:  Rocephin for UTI. Stopped 5/18/   HPI/Subjective: Confused, sleepy this am  Objective: Filed Vitals:   12/01/13 2119  BP: 155/70  Pulse: 61  Temp: 97.7 F (36.5 C)  Resp: 18    Intake/Output Summary (Last 24 hours) at  12/02/13 0941 Last data filed at 12/02/13 0932  Gross per 24 hour  Intake 1342.5 ml  Output    800 ml  Net  542.5 ml   Filed Weights   11/23/13 0000 11/29/13 0702  Weight: 67.132 kg (148 lb) 67.132 kg (148 lb)    Exam:   General: some confusion, answers questions  Cardiovascular: s1s2  Respiratory: ctab  Abdomen: soft NT ND  BS+  Musculoskeletal: painful ROM of the LE, no pedal edema.   R arm with red streaky area, non tender, improved from 5/24  Data Reviewed: Basic Metabolic Panel:  Recent Labs Lab 11/26/13 0421 11/27/13 0426 11/29/13 0427 12/01/13 0425 12/02/13 0403  NA 138 139 137 139 138  K 4.0 3.9 4.0 3.9 3.7  CL 102 103 102 104 103  CO2 22 20 25 26 24   GLUCOSE 104* 97 103* 106* 116*  BUN 11 17 23  25* 18  CREATININE 0.72 0.61 0.74 0.86 0.69  CALCIUM 8.3* 8.5 8.6 8.2* 8.3*   Liver Function Tests:  Recent Labs Lab 11/29/13 0427 12/01/13 0425  AST 16 12  ALT 12 11  ALKPHOS 133* 116  BILITOT 0.3 0.3  PROT 7.0 6.1  ALBUMIN 2.8* 2.5*   No results found for this basename: LIPASE, AMYLASE,  in the last 168 hours No results found for this basename: AMMONIA,  in the last 168 hours CBC:  Recent Labs Lab 11/27/13 0426 11/29/13 0427 12/01/13 0425  WBC 16.0* 15.6* 13.5*  HGB 11.5* 11.4* 10.3*  HCT 34.8* 35.2* 31.5*  MCV 86.4 87.1 86.5  PLT 455* 475* 424*   Cardiac Enzymes: No results found for this basename: CKTOTAL, CKMB, CKMBINDEX, TROPONINI,  in the last 168 hours BNP (last 3 results) No results found for this basename: PROBNP,  in the last 8760 hours CBG: No results found for this basename: GLUCAP,  in the last 168 hours  Recent Results (from the past 240 hour(s))  URINE CULTURE     Status: None   Collection Time    11/23/13  8:11 PM      Result Value Ref Range Status   Specimen Description URINE, CATHETERIZED   Final   Special Requests Normal   Final   Culture  Setup Time     Final   Value: 11/24/2013 03:35     Performed at [facility]     Final   Value: NO GROWTH     Performed at [facility]   Culture     Final   Value: NO GROWTH     Performed at [facility]   Report Status 11/25/2013 FINAL   Final  URINE CULTURE     Status: None   Collection Time    11/30/13 11:05 AM      Result Value Ref Range Status   Specimen  Description URINE, RANDOM BAG PED   Final   Special Requests none   Final   Culture  Setup Time     Final   Value: 11/30/2013 21:18     Performed at [place]     Final   Value: NO GROWTH     Performed at [facility]   Culture     Final   Value: NO GROWTH     Performed at [facility]   Report Status 12/01/2013 FINAL   Final     Studies: No results found.  Scheduled Meds: . ampicillin-sulbactam (  UNASYN) IV  1.5 g Intravenous 3 times per day  . cloNIDine  0.1 mg Oral BID  . enoxaparin (LOVENOX) injection  40 mg Subcutaneous QHS  . feeding supplement (ENSURE COMPLETE)  237 mL Oral TID BM  . ketorolac  15 mg Intravenous 3 times per day  . levothyroxine  50 mcg Oral QAC breakfast  . lisinopril  40 mg Oral Daily  . metoprolol succinate  100 mg Oral Daily  . pantoprazole  40 mg Oral Q1200   Continuous Infusions: . sodium chloride 20 mL/hr at 12/02/13 0715    Principal Problem:   Pathologic fracture Active Problems:   Mixed hyperlipidemia   Tobacco use disorder   Essential hypertension, benign   Metastatic lung cancer (metastasis from lung to other site)   Compression fracture of L4 lumbar vertebra   UTI (lower urinary tract infection)   Hypothyroidism   Leukocytosis   Thrombocytosis   Severe malnutrition    Time spent: 25 minutes.     [facility] Pager [phone]. If 7PM-7AM, please contact night-coverage at [email or web address], password [facility] 12/02/2013, 9:41 AM  LOS: 10 days

## 2013-12-02 NOTE — Consult Note (Signed)
[place] Liaison: Received request from [place] for family interest in [facility] 12/01/13. Chart reviewed. Will followup with CSW and family tomorrow 12/03/2013. Thank you. Erling Conte LCSW [phone]

## 2013-12-02 NOTE — Progress Notes (Addendum)
CSW assisting with d/c planning. PN reviewed. Dr. Marin Olp assisting with referral to [facility]. CSW will continue to follow to assist with residential hospice placement and to offer support to pt / family.  Werner Lean LCSW [phone]

## 2013-12-02 NOTE — Progress Notes (Signed)
Pt slept about 2 hrs tonight. Restless, attempting to get O O B, pulling at IV, Foley, etc. Finally removed Foley catheter with Bulb Intact. Unable to reorient or redirect. Combative at times.

## 2013-12-03 MED ORDER — HYDROMORPHONE HCL 1 MG/ML PO LIQD
1.0000 mg | ORAL | Status: DC | PRN
  Administered 2013-12-03: 1 mg via ORAL
  Filled 2013-12-03: qty 1

## 2013-12-03 MED ORDER — MELOXICAM 7.5 MG PO TABS: 7.5000 mg | ORAL_TABLET | Freq: Every day | ORAL | Status: AC

## 2013-12-03 MED ORDER — MORPHINE SULFATE (CONCENTRATE) 10 MG /0.5 ML PO SOLN: 5.0000 mg | ORAL | Status: DC | PRN

## 2013-12-03 MED ORDER — HYDROMORPHONE HCL 1 MG/ML PO LIQD: 1.0000 mg | ORAL | Status: AC | PRN

## 2013-12-03 MED ORDER — MORPHINE SULFATE (CONCENTRATE) 10 MG /0.5 ML PO SOLN
5.0000 mg | ORAL | Status: DC | PRN
  Filled 2013-12-03: qty 0.5

## 2013-12-03 MED ORDER — CLONIDINE HCL 0.2 MG PO TABS
0.2000 mg | ORAL_TABLET | Freq: Two times a day (BID) | ORAL | Status: DC
  Filled 2013-12-03: qty 1

## 2013-12-03 MED ORDER — LISINOPRIL 40 MG PO TABS: 40.0000 mg | ORAL_TABLET | Freq: Every day | ORAL | Status: AC

## 2013-12-03 MED ORDER — MELOXICAM 7.5 MG PO TABS
7.5000 mg | ORAL_TABLET | Freq: Every day | ORAL | Status: DC
  Administered 2013-12-03: 7.5 mg via ORAL
  Filled 2013-12-03: qty 1

## 2013-12-03 MED ORDER — CLONIDINE HCL 0.2 MG PO TABS: 0.2000 mg | ORAL_TABLET | Freq: Two times a day (BID) | ORAL | Status: AC

## 2013-12-03 MED ORDER — OXYCODONE HCL 5 MG PO TABS
5.0000 mg | ORAL_TABLET | Freq: Once | ORAL | Status: AC
  Administered 2013-12-03: 5 mg via ORAL
  Filled 2013-12-03: qty 1

## 2013-12-03 NOTE — Progress Notes (Signed)
Pt complaining of lower back pain. And is restless. Pt received PRN dose of Tylenol at 2216 and its not yet time for a second dose. MD on call notified. Orders received. Will continue to monitor pt.

## 2013-12-03 NOTE — Progress Notes (Signed)
CSW assisting with d/c planning. Pt will be d/c to [facility] today. [facility] has been called for transport. Pt / daughter are in agreement with d/c plan. Updated d/c summary has been received by [facility].  Werner Lean LCSW [phone]

## 2013-12-03 NOTE — Progress Notes (Signed)
Report called to [facility] at [facility]. All questions answered.   Last prn medication time given to RN, as well as last vital signs.     Patient trasnported via [facility] to SNF. Daughter left with mother, and will meet mother at [facility]

## 2013-12-03 NOTE — Progress Notes (Signed)
She was resting this morning. I did not want to wake her up. She still had the issues with confusion yesterday. It is hard to tell what is going on with respect to I told pain.  I don't think she is eating all that much.  She continues on the Unasyn for this cellulitis of the right arm. Is actually looking better. We probably can discontinue this.  I will see about general to be can place. I think this would be very reasonable.   Her blood pressure still is on the high side. She is afebrile. There really is no significant change on her physical exam.  She does appear to be comfortable. Hopefully, this agitation and paranoia will improve.  Pete E.

## 2013-12-03 NOTE — Discharge Summary (Addendum)
Physician Discharge Summary  [name] TOI:[id number] DOB: February 05, 1935 DOA: 11/22/2013  PCP: Gavin Pound, MD  Admit date: 11/22/2013 Discharge date: 12/03/2013  Time spent: 45 minutes  Discharge to [facility]End of life care  Discharge Diagnoses:    Widely metastatic Lung Cancer   Pathological fracture of the right hip/  L4 /T11 pathological compression Fx, Sacral Fx   Mixed hyperlipidemia   Tobacco use disorder   Essential hypertension, benign   Compression fracture of L4 lumbar vertebra   UTI (lower urinary tract infection)   Hypothyroidism   Leukocytosis   Thrombocytosis   Severe malnutrition   Discharge Condition:poor  Diet recommendation: comfort feeds  Filed Weights   11/23/13 0000 11/29/13 0702  Weight: 67.132 kg (148 lb) 67.132 kg (148 lb)    History of present illness:  Nichole Werner is a 79 y.o. female with recent diagnosis of Metastatic Lung Cancer who was sent for a Pet Scan this Afternoon which returned revealing diffuse metastatic Disease and a RUL Lung Tumor, as well as a Pathologic Fracture of the Right Hip. She had been having increased low back pain for several weeks and was sent for an MRI by Dr. Nelva Bush who interpreted a neoplastic process of the L4 Compression fracture which was found. She reported having right hip pain for the past 4 days and she was sent for an PET scan today by Dr. Marin Olp, the results of which revealed a pathologic fracture of the right hip. She was referred tot he ED. She denies any history of trauma or fall. She denies having fevers chills or night sweats, she does report having a loss of appetite over the past few months and an unintentional weight loss of 20 pounds over the past 6 months   Hospital Course:  Pathological fracture of the right hip/  L4 /T11 pathological compression Fx, Sacral Fx  -IR was following, planned kyphoplasty of L4/T11 lesion, sacroplasty for  5/22, but she declined this at the last  minute at [facility]  -Dr. Marin Olp did not think she would comply with XRT  -Since she doesn't have much pain at this time, i think she may not need those procedures anyway  -Now plans for [facility]  -Medically stable for discharge once bed available  -added PO dilaudid for pain regardless of delirium  Delirium/confusion  -likely due to meds, prolonged hospitalization  - off narcotics now, haldol PRN  - cannot rule out brain mets, but no further workup indicated   Widely Metastatic lung cancer;  - Dr Marin Olp following  - she doesn't want any aggressive measures at this time.  - DNR, appropriate for residential Hospice   Thrombophlebitis  -IV site, IV out  -cold compresses  -improved, was briefly on IV Unasyn now stopped  HTN  -BP improved but still high and fluctuates -continue CLonidine, increase lisinopril, continue Toprol  Hypothyroid-TSH within normal limits. and continue Levothyroxine,.   Tobacco Use Disorder- Nicotine Patch daily   Procedures:  Attempted Kyphoplasty/sacroplasty pt declined  Consultations:  Dr.Ennever  Ortho Dr.Alusio  IR  Discharge Exam: Filed Vitals:   12/03/13 1133  BP: 114/83  Pulse: 63  Temp: 98 F (36.7 C)  Resp: 16    General: alert, awake, confused Cardiovascular: S1S2/RRR Respiratory: CTAB  Discharge Instructions You were cared for by a hospitalist during your hospital stay. If you have any questions about your discharge medications or the care you received while you were in the hospital after you are discharged, you  can call the unit and asked to speak with the hospitalist on call if the hospitalist that took care of you is not available. Once you are discharged, your primary care physician will handle any further medical issues. Please note that NO REFILLS for any discharge medications will be authorized once you are discharged, as it is imperative that you return to your primary care physician (or establish a relationship  with a primary care physician if you do not have one) for your aftercare needs so that they can reassess your need for medications and monitor your lab values.      Discharge Instructions   Diet general    Complete by:  As directed      Increase activity slowly    Complete by:  As directed             Medication List    STOP taking these medications       cephALEXin 500 MG capsule  Commonly known as:  KEFLEX     HYDROcodone-acetaminophen 5-325 MG per tablet  Commonly known as:  NORCO/VICODIN     naproxen sodium 220 MG tablet  Commonly known as:  ANAPROX      TAKE these medications       beta carotene w/minerals tablet  Take 1 tablet by mouth daily.     cloNIDine 0.2 MG tablet  Commonly known as:  CATAPRES  Take 1 tablet (0.2 mg total) by mouth 2 (two) times daily.     HYDROmorphone HCl 1 MG/ML Liqd  Commonly known as:  DILAUDID  Take 1 mL (1 mg total) by mouth every 4 (four) hours as needed for severe pain.     levothyroxine 50 MCG tablet  Commonly known as:  SYNTHROID, LEVOTHROID  Take 50 mcg by mouth daily before breakfast.     lisinopril 40 MG tablet  Commonly known as:  PRINIVIL,ZESTRIL  Take 1 tablet (40 mg total) by mouth daily.     meloxicam 7.5 MG tablet  Commonly known as:  MOBIC  Take 1 tablet (7.5 mg total) by mouth daily.     metoprolol succinate 100 MG 24 hr tablet  Commonly known as:  TOPROL-XL  Take 100 mg by mouth daily. Take with or immediately following a meal.     polyethylene glycol packet  Commonly known as:  MIRALAX / GLYCOLAX  Take 17 g by mouth 2 (two) times daily as needed for moderate constipation.       Allergies  Allergen Reactions  . Benadryl [Diphenhydramine]     Speeds everything up  . Benzodiazepines     Doesn't know  . Contrast Media [Iodinated Diagnostic Agents]     Pt states it burns  . Morphine And Related     Keeps her awake  . Latex Rash      The results of significant diagnostics from this  hospitalization (including imaging, microbiology, ancillary and laboratory) are listed below for reference.    Significant Diagnostic Studies: Dg Lumbar Spine Complete  11/12/2013   CLINICAL DATA:  Low back pain  EXAM: LUMBAR SPINE - COMPLETE 4+ VIEW  COMPARISON:  None  FINDINGS: There is a compression deformity involving the L4 vertebra. This is age-indeterminate with loss of approximately 15% of the vertebral body height. Mild multi level lumbar degenerative disc disease is noted. This is most severe at L5-S1. Atherosclerotic disease involves the abdominal aorta.  IMPRESSION: 1. L4 compression fracture is age indeterminate. 2. Lumbar degenerative disc disease 3. Atherosclerosis.  Electronically Signed   By: Kerby Moors M.D.   On: 11/12/2013 18:00   Dg Hip Complete Right  11/23/2013   CLINICAL DATA:  Right lesser trochanter fracture, right hip pain  EXAM: RIGHT HIP - COMPLETE 2+ VIEW  COMPARISON:  No comparisons  FINDINGS: Iliac spurring is noted. Right lesser trochanter fracture is identified with mild distraction. Bowel material and contrast partly obscure the pelvis. No displaced pelvic fracture identified. Lumbar spine disc degenerative change partly visualized.  IMPRESSION: Right lesser trochanter fracture.   Electronically Signed   By: Conchita Paris M.D.   On: 11/23/2013 11:35   Ct Head W Wo Contrast  11/22/2013   CLINICAL DATA:  Right hip fracture, evaluation for intracranial metastasis.  EXAM: CT HEAD WITHOUT AND WITH CONTRAST  TECHNIQUE: Contiguous axial images were obtained from the base of the skull through the vertex without and with intravenous contrast  CONTRAST:  160mL OMNIPAQUE IOHEXOL 300 MG/ML  SOLN  COMPARISON:  NM PET IMAGE INITIAL (PI) SKULL BASE TO THIGH dated 11/22/2013; DG LUMBAR SPINE COMPLETE dated 11/12/2013  FINDINGS: Moderate to severe ventriculomegaly, predominantly on the basis of global parenchymal brain volume loss, however there is mild effacement of the sulci at the  convexities. No intraparenchymal hemorrhage, mass effect nor midline shift. Prompt 1 supratentorial white matter hypodensities are non-specific suggest sequelae of chronic small vessel ischemic disease. No acute large vascular territory infarcts. Remote right basal ganglia cystic lacunar infarct in addition to tiny hypodensities in bilateral basal ganglia and thalamus suggesting remote ischemia.  No abnormal extra-axial fluid collections. Basal cisterns are patent. Moderate calcific atherosclerosis of the carotid siphons.  No skull fracture. Subcentimeter lucency in the right occipital calvarium, axial 15/29. The included ocular globes and orbital contents are non-suspicious ; the elongated symmetric ocular globes may reflect myopia for which ophthalmological examination could be considered. The mastoid aircells and included paranasal sinuses are well-aerated.  IMPRESSION: No acute intracranial process ; no CT findings of intracranial metastasis though, MRI of the brain with contrast would be more sensitive. However, subcentimeter lucency in right occipital calvarium, this nonspecific, and metastasis is not excluded (should MRI of the brain be performed, recommend fat saturation post gad imaging).  Moderate to severe global brain atrophy, with disproportionate sulcal effacement of the convexities which can be assessed with normal pressure hydrocephalus.  Moderate to severe white matter changes suggest chronic small vessel ischemic disease with remote thalamic and basal ganglia lacunar infarcts.   Electronically Signed   By: Elon Alas   On: 11/22/2013 22:23   Ct Chest W Contrast  11/22/2013   CLINICAL DATA:  Right hip fracture, suspected lung malignancy and bony metastases  EXAM: CT CHEST, ABDOMEN, AND PELVIS WITH CONTRAST  TECHNIQUE: Multidetector CT imaging of the chest, abdomen and pelvis was performed following the standard protocol during bolus administration of intravenous contrast.  CONTRAST:  164mL  OMNIPAQUE IOHEXOL 300 MG/ML  SOLN  COMPARISON:  PET-CT study of today's date  FINDINGS: CT CHEST FINDINGS  At lung window settings there are emphysematous changes bilaterally. There is an abnormal 1.3 cm diameter spiculated density anterior to the superior vena cava in the superior medial aspect of the right upper lobe. No pulmonary parenchymal masses are demonstrated elsewhere. There is compressive atelectasis posteriorly, bilaterally.  At mediastinal window settings the cardiac chambers are mildly enlarged. There are coronary artery calcifications present. The caliber of the ascending thoracic aorta and aortic arch is normal. There is mild failure taper of the caliber of  the descending thoracic aorta which measures a maximum approximately 3.2 cm. The study was not tailored for pulmonary arterial tree evaluation. The patient has known hypermetabolic mediastinal and right hilar lymph nodes.  There is compression of the body of T3 amounting to approximately 50% and T11 amounting to approximately 15%. The T3 region was noted to be hypermetabolic on the PET scan with findings suspicious for surrounding tumor. No retropulsed bony fragments are demonstrated on the CT images.  CT ABDOMEN AND PELVIS FINDINGS  The liver exhibits mild to moderate ductal dilation. The gallbladder is adequately distended and contains calcified and gas-containing stones. The spleen is normal in size and exhibits punctate calcifications. The pancreas is normal in density and contour. The partially contrast filled loops of small and large bowel exhibit no acute abnormalities. The kidneys exhibit no evidence of obstruction. There is a 2.6 x 1.6 hypodensity in the midpole of the left kidney with Hounsfield measurement of +6 immediately post contrast and 0 on delayed images. This likely reflects a cyst. . The distended urinary bladder is normal in appearance. There is an aneurysm of the suprarenal portion of the abdominal aorta which contains  considerable mural thrombus. It measures approximately 3.6 cm in diameter and extends for a length of approximately 5.6 cm.  The uterus is surgically absent. There is no adnexal mass. There is no free intra-abdominal or pelvic fluid. There is no inguinal or umbilical hernia.  There is partial compression of the body of L4 with loss of height of approximately 50%. The bony pelvis exhibits no acute fracture. There is known metastatic disease involving the central portion of the sacrum which is manifested by mildly increased density within the marrow space. There is a fracture involving the lesser trochanter of the right hip with surrounding lytic changes. The left hip appears intact.  IMPRESSION: 1. There is a small spiculated mass medially in the right upper lobe which is known to be hypermetabolic and worrisome for malignancy. 2. There are multiples abnormalities within the skeleton as described consistent with metastatic disease. There is a fracture involving the lesser trochanter of the right femur with surrounding destructive changes. This demonstrated abnormal uptake on today's PET CT study. Similar abnormality without evidence of fracture is demonstrated in the upper sacral body. 3. There is a suprarenal abdominal aortic aneurysm without evidence of leakage. 4. There are gallstones present. There is intrahepatic ductal dilation. No pancreatic mass is demonstrated.   Electronically Signed   By: David  Martinique   On: 11/22/2013 22:45   Mr Thoracic Spine Wo Contrast  11/25/2013   CLINICAL DATA:  79 year old female with metastatic lung cancer. Pathologic fractures. Staging.  EXAM: MRI THORACIC AND LUMBAR SPINE WITHOUT CONTRAST  TECHNIQUE: Multiplanar and multiecho pulse sequences of the thoracic and lumbar spine were obtained without intravenous contrast.  COMPARISON:  Hip MRI 11/25/2013, reported separately. Chest abdomen pelvis CT 11/22/2013.  FINDINGS: The examination had to be discontinued prior to completion  due to pain.  MR THORACIC SPINE FINDINGS  Limited sagittal imaging of the cervical spine is grossly negative.  No sagittal T2 weighted imaging of the thoracic spine was obtained. Axial thoracic spine imaging is degraded by motion despite repeated imaging attempts.  There is a pathologic fracture of the T3 vertebral body with severe loss of height. There is mild retropulsion of bone. There is subsequent mild spinal stenosis but no definite cord compression.  Adjacent to this level there is abnormal signal in the right T4 pedicle and posterior vertebral  body compatible with metastatic disease. The right T4 neural foramen may be affected, but there is not definitely additional epidural tumor at this level. No cord compression. There is superimposed degenerative facet hypertrophy at this level.  No metastatic disease identified in T1, T2, or from the T5 to the T10 thoracic vertebrae.  There is a right paracentral disc herniation at T9-T10 resulting in effaced ventral CSF space but no significant spinal stenosis (series 7, image 29).  There is a pathologic moderate compression fracture of the T11 vertebral body. There is mild associated posterior disc osteophyte protrusion at T11-T12 narrowing the ventral CSF space, but no significant spinal stenosis or cord compression (note superimposed mild facet hypertrophy).  No metastasis identified in the T12 vertebra.  No spinal cord signal abnormality identified. Conus medullaris better depicted on the lumbar images described below.  Abnormal dependent increased signal in the right lung has increased. There is now a small layering right pleural effusion. Stable visualized upper abdominal viscera.  MR LUMBAR SPINE FINDINGS  Sagittal and axial images degraded by motion despite repeated imaging attempts.  Normal lumbar segmentation. No lumbar metastatic disease identified from the L1 level to the L3 level.  Moderate pathologic compression fracture of L4 with tumor involving the  left pedicle and lamina. There is evidence of early epidural tumor at this level, chiefly at the left L4 neural foramen (series 12, image 10). Multifactorial moderate spinal and left lateral recess stenosis occur. No L3 foraminal involvement.  Previous left laminectomy at L4-L5. No definite L5 metastatic disease.  Normal non contrast appearance of the conus medullaris. Detail of the cauda equina Limited due to motion.  Central tumor in the sacrum eccentric to the right affecting the S1 and S2 levels. There is epidural tumor along the course of the exiting right S1 nerve (Series 12, image 3 and series 15, image 32). No sacral spinal stenosis.  Stable visualized abdominal viscera, including aortic aneurysm.  IMPRESSION: MR THORACIC SPINE IMPRESSION  1. Metastatic disease involving the T3, T4, and T11 vertebral bodies with pathologic compression fractures at T3 and T11. Mild multifactorial spinal stenosis at these levels but no thoracic cord compression. Suspect early epidural tumor at the right T4 neural foramen. 2. Small layering right pleural effusion. Increased abnormal signal in the dependent right lung.  MR LUMBAR SPINE IMPRESSION  1. Lumbar metastatic disease limited to the L4 vertebral body with pathologic compression fracture. Subsequent multifactorial moderate L3-L4 spinal and left lateral recess stenosis. Suspect early epidural tumor at the left L4 neuroforamen. 2. Midline to right of midline tumor in the sacrum at S1 and S2. Epidural tumor affecting the course of the exiting right S1 nerve.   Electronically Signed   By: Lars Pinks M.D.   On: 11/25/2013 14:43   Mr Lumbar Spine Wo Contrast  11/25/2013   CLINICAL DATA:  79 year old female with metastatic lung cancer. Pathologic fractures. Staging.  EXAM: MRI THORACIC AND LUMBAR SPINE WITHOUT CONTRAST  TECHNIQUE: Multiplanar and multiecho pulse sequences of the thoracic and lumbar spine were obtained without intravenous contrast.  COMPARISON:  Hip MRI  11/25/2013, reported separately. Chest abdomen pelvis CT 11/22/2013.  FINDINGS: The examination had to be discontinued prior to completion due to pain.  MR THORACIC SPINE FINDINGS  Limited sagittal imaging of the cervical spine is grossly negative.  No sagittal T2 weighted imaging of the thoracic spine was obtained. Axial thoracic spine imaging is degraded by motion despite repeated imaging attempts.  There is a pathologic fracture of the  T3 vertebral body with severe loss of height. There is mild retropulsion of bone. There is subsequent mild spinal stenosis but no definite cord compression.  Adjacent to this level there is abnormal signal in the right T4 pedicle and posterior vertebral body compatible with metastatic disease. The right T4 neural foramen may be affected, but there is not definitely additional epidural tumor at this level. No cord compression. There is superimposed degenerative facet hypertrophy at this level.  No metastatic disease identified in T1, T2, or from the T5 to the T10 thoracic vertebrae.  There is a right paracentral disc herniation at T9-T10 resulting in effaced ventral CSF space but no significant spinal stenosis (series 7, image 29).  There is a pathologic moderate compression fracture of the T11 vertebral body. There is mild associated posterior disc osteophyte protrusion at T11-T12 narrowing the ventral CSF space, but no significant spinal stenosis or cord compression (note superimposed mild facet hypertrophy).  No metastasis identified in the T12 vertebra.  No spinal cord signal abnormality identified. Conus medullaris better depicted on the lumbar images described below.  Abnormal dependent increased signal in the right lung has increased. There is now a small layering right pleural effusion. Stable visualized upper abdominal viscera.  MR LUMBAR SPINE FINDINGS  Sagittal and axial images degraded by motion despite repeated imaging attempts.  Normal lumbar segmentation. No lumbar  metastatic disease identified from the L1 level to the L3 level.  Moderate pathologic compression fracture of L4 with tumor involving the left pedicle and lamina. There is evidence of early epidural tumor at this level, chiefly at the left L4 neural foramen (series 12, image 10). Multifactorial moderate spinal and left lateral recess stenosis occur. No L3 foraminal involvement.  Previous left laminectomy at L4-L5. No definite L5 metastatic disease.  Normal non contrast appearance of the conus medullaris. Detail of the cauda equina Limited due to motion.  Central tumor in the sacrum eccentric to the right affecting the S1 and S2 levels. There is epidural tumor along the course of the exiting right S1 nerve (Series 12, image 3 and series 15, image 32). No sacral spinal stenosis.  Stable visualized abdominal viscera, including aortic aneurysm.  IMPRESSION: MR THORACIC SPINE IMPRESSION  1. Metastatic disease involving the T3, T4, and T11 vertebral bodies with pathologic compression fractures at T3 and T11. Mild multifactorial spinal stenosis at these levels but no thoracic cord compression. Suspect early epidural tumor at the right T4 neural foramen. 2. Small layering right pleural effusion. Increased abnormal signal in the dependent right lung.  MR LUMBAR SPINE IMPRESSION  1. Lumbar metastatic disease limited to the L4 vertebral body with pathologic compression fracture. Subsequent multifactorial moderate L3-L4 spinal and left lateral recess stenosis. Suspect early epidural tumor at the left L4 neuroforamen. 2. Midline to right of midline tumor in the sacrum at S1 and S2. Epidural tumor affecting the course of the exiting right S1 nerve.   Electronically Signed   By: Lars Pinks M.D.   On: 11/25/2013 14:43   Ct Abdomen Pelvis W Contrast  11/22/2013   CLINICAL DATA:  Right hip fracture, suspected lung malignancy and bony metastases  EXAM: CT CHEST, ABDOMEN, AND PELVIS WITH CONTRAST  TECHNIQUE: Multidetector CT imaging  of the chest, abdomen and pelvis was performed following the standard protocol during bolus administration of intravenous contrast.  CONTRAST:  127mL OMNIPAQUE IOHEXOL 300 MG/ML  SOLN  COMPARISON:  PET-CT study of today's date  FINDINGS: CT CHEST FINDINGS  At lung window settings  there are emphysematous changes bilaterally. There is an abnormal 1.3 cm diameter spiculated density anterior to the superior vena cava in the superior medial aspect of the right upper lobe. No pulmonary parenchymal masses are demonstrated elsewhere. There is compressive atelectasis posteriorly, bilaterally.  At mediastinal window settings the cardiac chambers are mildly enlarged. There are coronary artery calcifications present. The caliber of the ascending thoracic aorta and aortic arch is normal. There is mild failure taper of the caliber of the descending thoracic aorta which measures a maximum approximately 3.2 cm. The study was not tailored for pulmonary arterial tree evaluation. The patient has known hypermetabolic mediastinal and right hilar lymph nodes.  There is compression of the body of T3 amounting to approximately 50% and T11 amounting to approximately 15%. The T3 region was noted to be hypermetabolic on the PET scan with findings suspicious for surrounding tumor. No retropulsed bony fragments are demonstrated on the CT images.  CT ABDOMEN AND PELVIS FINDINGS  The liver exhibits mild to moderate ductal dilation. The gallbladder is adequately distended and contains calcified and gas-containing stones. The spleen is normal in size and exhibits punctate calcifications. The pancreas is normal in density and contour. The partially contrast filled loops of small and large bowel exhibit no acute abnormalities. The kidneys exhibit no evidence of obstruction. There is a 2.6 x 1.6 hypodensity in the midpole of the left kidney with Hounsfield measurement of +6 immediately post contrast and 0 on delayed images. This likely reflects a  cyst. . The distended urinary bladder is normal in appearance. There is an aneurysm of the suprarenal portion of the abdominal aorta which contains considerable mural thrombus. It measures approximately 3.6 cm in diameter and extends for a length of approximately 5.6 cm.  The uterus is surgically absent. There is no adnexal mass. There is no free intra-abdominal or pelvic fluid. There is no inguinal or umbilical hernia.  There is partial compression of the body of L4 with loss of height of approximately 50%. The bony pelvis exhibits no acute fracture. There is known metastatic disease involving the central portion of the sacrum which is manifested by mildly increased density within the marrow space. There is a fracture involving the lesser trochanter of the right hip with surrounding lytic changes. The left hip appears intact.  IMPRESSION: 1. There is a small spiculated mass medially in the right upper lobe which is known to be hypermetabolic and worrisome for malignancy. 2. There are multiples abnormalities within the skeleton as described consistent with metastatic disease. There is a fracture involving the lesser trochanter of the right femur with surrounding destructive changes. This demonstrated abnormal uptake on today's PET CT study. Similar abnormality without evidence of fracture is demonstrated in the upper sacral body. 3. There is a suprarenal abdominal aortic aneurysm without evidence of leakage. 4. There are gallstones present. There is intrahepatic ductal dilation. No pancreatic mass is demonstrated.   Electronically Signed   By: David  Martinique   On: 11/22/2013 22:45   Mr Hip Right Wo Contrast  11/25/2013   CLINICAL DATA:  Metastatic lung cancer. Pathologic fracture of the lesser trochanter of the proximal right femur.  EXAM: MRI OF THE RIGHT HIP WITHOUT CONTRAST  TECHNIQUE: Multiplanar, multisequence MR imaging was performed. No intravenous contrast was administered.  COMPARISON:  Radiographs dated  11/23/2013 and CT scan and PET scan dated 11/22/2013  FINDINGS: There is a pathologic fracture of the right lesser trochanter with avulsion of the insertion of iliopsoas tendon with edema  and/or hemorrhage in the adjacent soft tissues. There is also a 13 mm metastasis in the posterior superior aspect of the left femoral head.  There is a 5.2 x 2.4 cm destructive mass in the right side of the first sacral segment touching the right S1 nerve.  There are several small lymph nodes in the pelvis which are not pathologically enlarged but these could represent metastatic disease. These are seen on image number 5 and image number 16 of series 3.  IMPRESSION: Metastatic lesions in the sacrum, left femoral head and in the right lesser trochanter with a pathologic fracture and avulsion of the insertion of the right iliopsoas tendon.   Electronically Signed   By: Rozetta Nunnery M.D.   On: 11/25/2013 13:44   Nm Pet Image Initial (pi) Skull Base To Thigh  11/22/2013   CLINICAL DATA:  Initial treatment strategy for Lung cancer.  EXAM: NUCLEAR MEDICINE PET SKULL BASE TO THIGH  TECHNIQUE: 8.8 mCi F-18 FDG was injected intravenously. Full-ring PET imaging was performed from the skull base to thigh after the radiotracer. CT data was obtained and used for attenuation correction and anatomic localization.  FASTING BLOOD GLUCOSE:  Value: 110 mg/dl  COMPARISON:  None.  FINDINGS: NECK  No hypermetabolic lymph nodes in the neck.  CHEST  Moderate changes of centrilobular emphysema identified. The pulmonary nodule within the right upper lobe measures 1.3 cm and has an SUV max equal to 6.3. Calcified granuloma is identified within the left lower lobe.  Normal heart size. No pericardial effusion. There is calcified atherosclerotic disease involving the thoracic aorta. Calcifications also involve the LAD, left circumflex and RCA coronary arteries. Hypermetabolic right hilar lymph node has an SUV max equal to 3.9. Calcified left sided  mediastinal and left hilar lymph nodes are identified.  ABDOMEN/PELVIS  Multiple stones are identified within the gallbladder. The pancreas is within normal limits. Calcified granulomas identified within the spleen.  The adrenal glands are both normal. Left renal cyst is identified. This is incompletely characterized without IV contrast. No hypermetabolic upper abdominal or pelvic lymph nodes identified.  Calcified atherosclerotic disease involves the abdominal aorta. The aorta has a maximum AP dimension of 3.5 cm.  SKELETON  Multifocal hypermetabolic bone metastases are identified. Metastasis within the central portion of the sacrum measure 4.5 cm and has an SUV max equal to 14.8. There is a metastasis with pathologic fracture involving the lesser trochanter of the proximal right femur. This has an SUV max equal to 13.8. Tumor involving the right side of the C1 vertebra has an SUV max equal to 13.7. There is tumor involving the L4 vertebra with associated superior endplate fracture deformity. At T3 and T4 tumor appears to extend posteriorly into the epidural space.  IMPRESSION: 1. Examination is positive for hypermetabolic tumor within the right upper lobe with associated ipsilateral hilar lymph node metastasis. 2. Multifocal bone metastasis involving the axial and proximal appendicular skeleton. There are associated pathologic fractures involving the superior endplate of L4 and the lesser trochanter of the proximal right femur. Suspicious activity posterior to the T3 and T4 vertebra may reflect epidural extension of tumor. This could be better assessed with thoracic spine MRI. 3. Atherosclerotic disease including multi vessel coronary artery calcifications. Aneurysmal dilatation of the infrarenal abdominal aorta measures 3.5 cm.   Electronically Signed   By: Kerby Moors M.D.   On: 11/22/2013 14:45    Microbiology: Recent Results (from the past 240 hour(s))  URINE CULTURE     Status:  None   Collection Time     11/23/13  8:11 PM      Result Value Ref Range Status   Specimen Description URINE, CATHETERIZED   Final   Special Requests Normal   Final   Culture  Setup Time     Final   Value: 11/24/2013 03:35     Performed at [facility]     Final   Value: NO GROWTH     Performed at [facility]   Culture     Final   Value: NO GROWTH     Performed at [facility]   Report Status 11/25/2013 FINAL   Final  URINE CULTURE     Status: None   Collection Time    11/30/13 11:05 AM      Result Value Ref Range Status   Specimen Description URINE, RANDOM BAG PED   Final   Special Requests none   Final   Culture  Setup Time     Final   Value: 11/30/2013 21:18     Performed at [facility]     Final   Value: NO GROWTH     Performed at [facility]   Culture     Final   Value: NO GROWTH     Performed at [facility]   Report Status 12/01/2013 FINAL   Final     Labs: Basic Metabolic Panel:  Recent Labs Lab 11/27/13 0426 11/29/13 0427 12/01/13 0425 12/02/13 0403  NA 139 137 139 138  K 3.9 4.0 3.9 3.7  CL 103 102 104 103  CO2 20 25 26 24   GLUCOSE 97 103* 106* 116*  BUN 17 23 25* 18  CREATININE 0.61 0.74 0.86 0.69  CALCIUM 8.5 8.6 8.2* 8.3*   Liver Function Tests:  Recent Labs Lab 11/29/13 0427 12/01/13 0425  AST 16 12  ALT 12 11  ALKPHOS 133* 116  BILITOT 0.3 0.3  PROT 7.0 6.1  ALBUMIN 2.8* 2.5*   No results found for this basename: LIPASE, AMYLASE,  in the last 168 hours No results found for this basename: AMMONIA,  in the last 168 hours CBC:  Recent Labs Lab 11/27/13 0426 11/29/13 0427 12/01/13 0425  WBC 16.0* 15.6* 13.5*  HGB 11.5* 11.4* 10.3*  HCT 34.8* 35.2* 31.5*  MCV 86.4 87.1 86.5  PLT 455* 475* 424*   Cardiac Enzymes: No results found for this basename: CKTOTAL, CKMB, CKMBINDEX, TROPONINI,  in the last 168 hours BNP: BNP (last 3 results) No results found for this basename:  PROBNP,  in the last 8760 hours CBG: No results found for this basename: GLUCAP,  in the last 168 hours     Signed:  [facility] 12/03/2013, 11:55 AM

## 2013-12-05 ENCOUNTER — Encounter (HOSPITAL_COMMUNITY): Payer: Medicare Other

## 2013-12-05 ENCOUNTER — Encounter (HOSPITAL_COMMUNITY): Admission: RE | Admit: 2013-12-05 | Payer: Medicare Other | Source: Ambulatory Visit

## 2013-12-10 ENCOUNTER — Encounter: Payer: Self-pay | Admitting: *Deleted

## 2013-12-10 NOTE — Progress Notes (Signed)
Received phone call this morning from patient's nurse that the pt had fallen and was found on the floor. Nurse stated that pt fell on right hip (the one with previous fracture). Nurse stated that pt showed no obvious problems and no injuries. Dr Marin Olp was informed of this occurrence.

## 2013-12-30 ENCOUNTER — Encounter: Payer: Self-pay | Admitting: Nurse Practitioner

## 2013-12-30 ENCOUNTER — Telehealth: Payer: Self-pay | Admitting: Internal Medicine

## 2013-12-30 NOTE — Progress Notes (Signed)
Received notice that pt expired on 12/18/2013 @ 0400am. Dr. Marin Olp aware.

## 2013-12-30 NOTE — Telephone Encounter (Signed)
Notified by hospice nurse of patients passing.

## 2014-01-08 DEATH — deceased

## 2014-07-24 ENCOUNTER — Encounter (HOSPITAL_COMMUNITY): Payer: Self-pay | Admitting: Interventional Radiology

## 2015-09-05 IMAGING — CR DG LUMBAR SPINE COMPLETE 4+V
5 series · 5 of 5 positions shown · non-contrast
Comparison: None

CLINICAL DATA: Low back pain

EXAM:
LUMBAR SPINE - COMPLETE 4+ VIEW

[t lumbar spine ap]
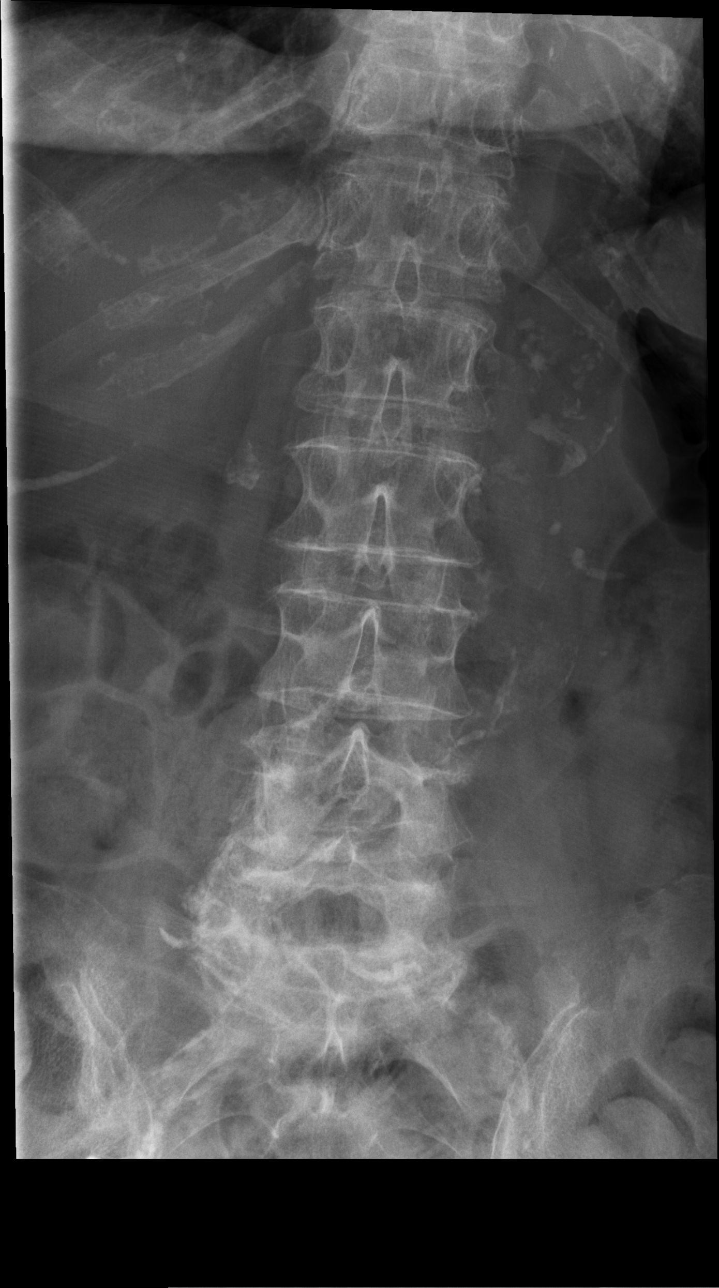

[t lumbar spine obl (1 of 2)]
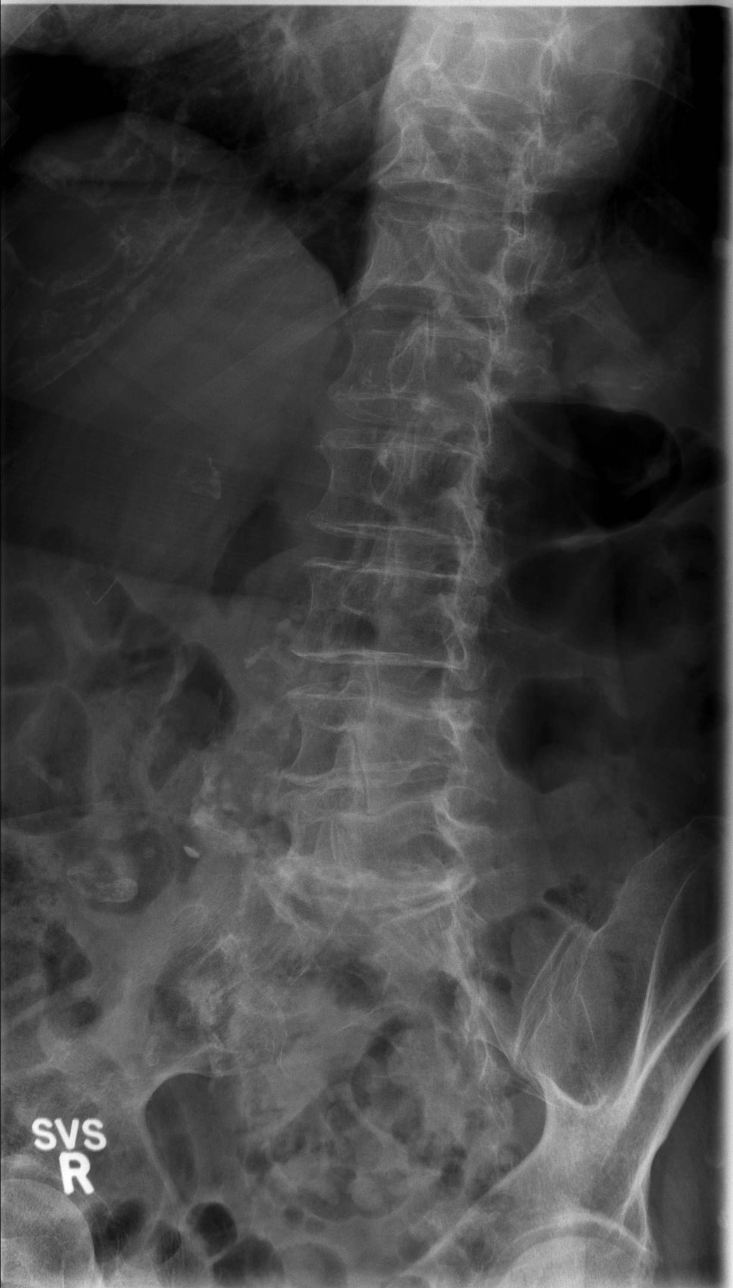

[t lumbar spine obl (2 of 2)]
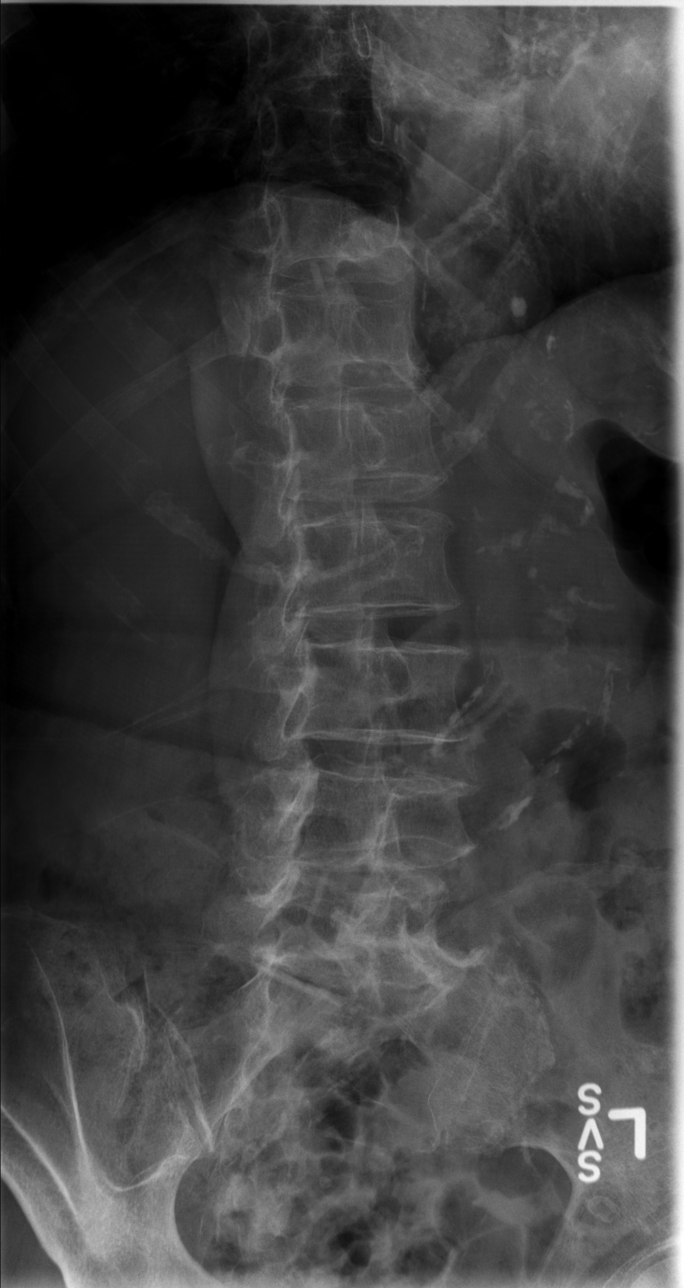

[t lumbar spine lat]
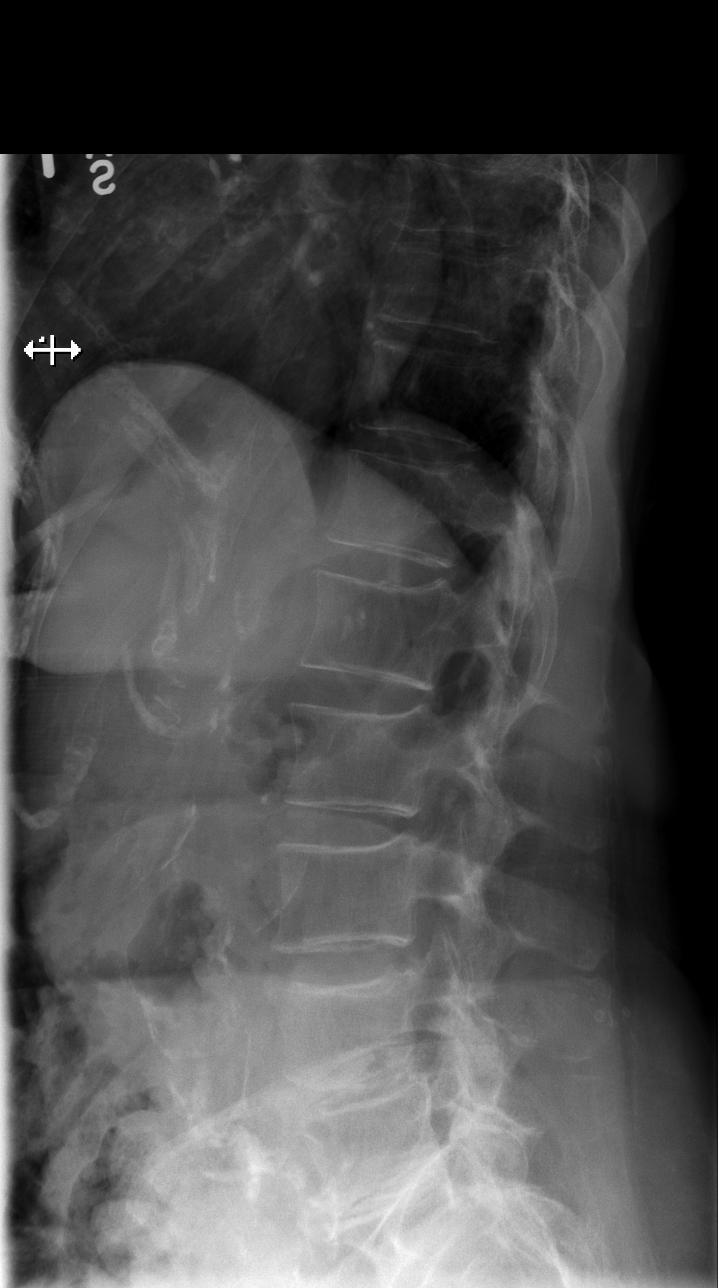

[t lumbar l-5 s-1 spot]
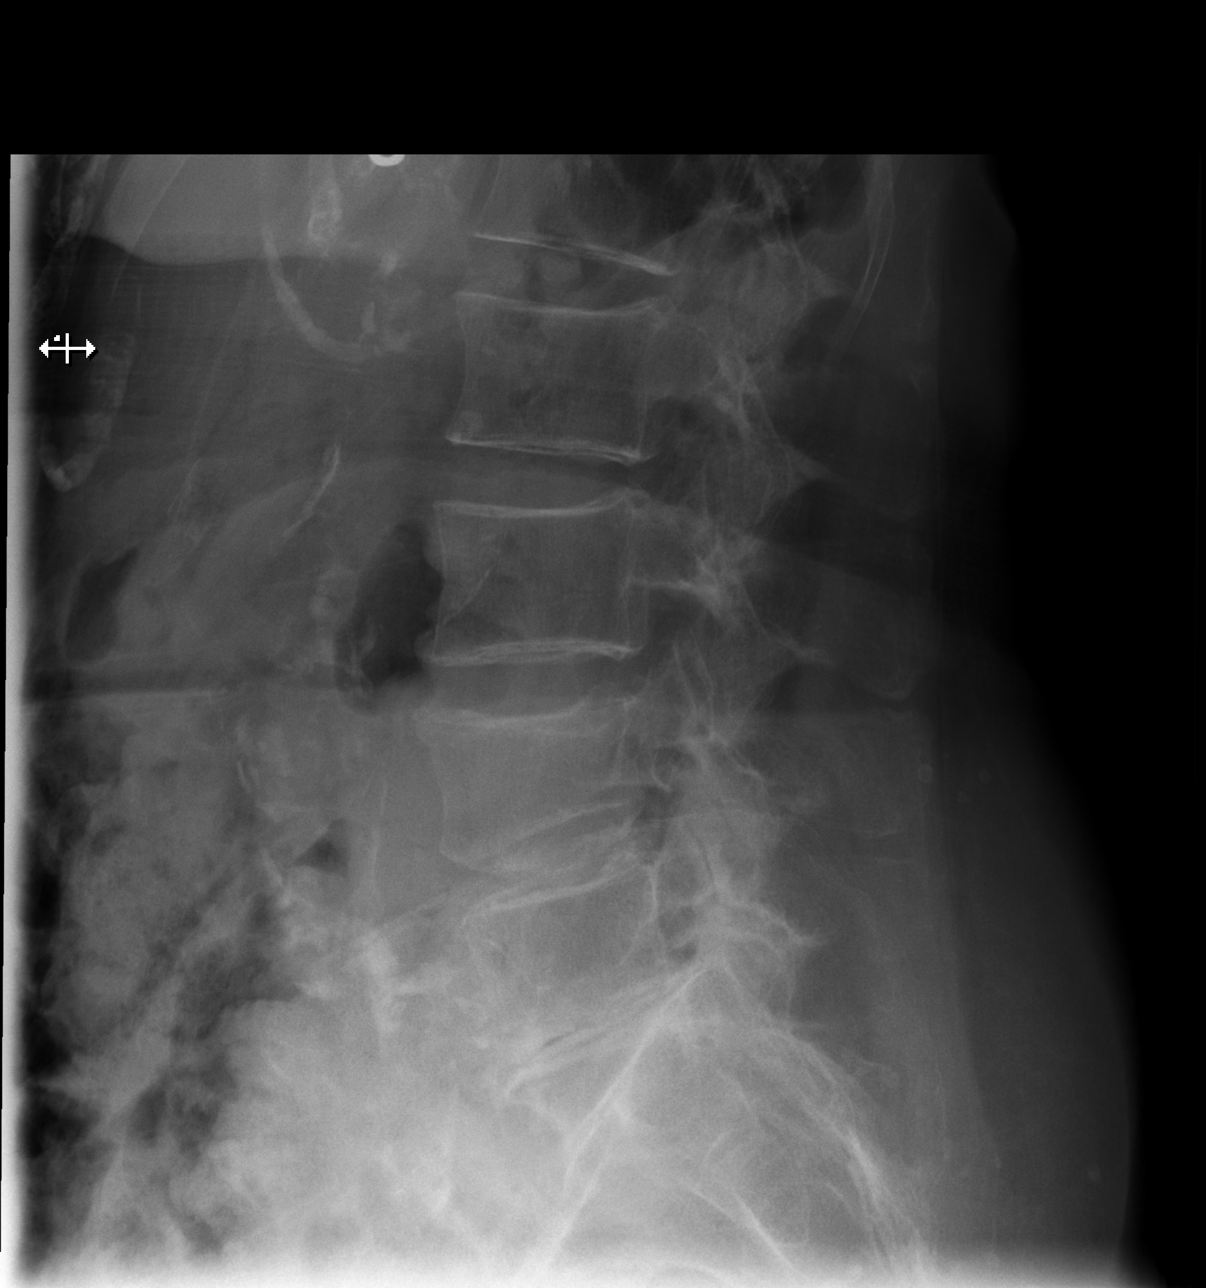

[5 of 5 positions shown; findings below may reference images not displayed]

FINDINGS: There is a compression deformity involving the L4 vertebra. This is
age-indeterminate with loss of approximately 15% of the vertebral
body height. Mild multi level lumbar degenerative disc disease is
noted. This is most severe at L5-S1. Atherosclerotic disease
involves the abdominal aorta.
IMPRESSION: 1. L4 compression fracture is age indeterminate.
2. Lumbar degenerative disc disease
3. Atherosclerosis.
